# Patient Record
Sex: Female | Born: 1970 | Race: White | Hispanic: No | Marital: Married | Smoking: Current every day smoker
Health system: Southern US, Community
[De-identification: ages and names within clinical notes are randomized; demographics above are authoritative.]

## PROBLEM LIST (undated history)

## (undated) DIAGNOSIS — G2581 Restless legs syndrome: Secondary | ICD-10-CM

## (undated) DIAGNOSIS — F32A Depression, unspecified: Secondary | ICD-10-CM

## (undated) DIAGNOSIS — M545 Low back pain, unspecified: Secondary | ICD-10-CM

## (undated) DIAGNOSIS — F419 Anxiety disorder, unspecified: Secondary | ICD-10-CM

## (undated) DIAGNOSIS — T7840XA Allergy, unspecified, initial encounter: Secondary | ICD-10-CM

## (undated) DIAGNOSIS — K219 Gastro-esophageal reflux disease without esophagitis: Secondary | ICD-10-CM

## (undated) DIAGNOSIS — Z01419 Encounter for gynecological examination (general) (routine) without abnormal findings: Secondary | ICD-10-CM

## (undated) DIAGNOSIS — G4700 Insomnia, unspecified: Secondary | ICD-10-CM

## (undated) DIAGNOSIS — M1990 Unspecified osteoarthritis, unspecified site: Secondary | ICD-10-CM

## (undated) DIAGNOSIS — F329 Major depressive disorder, single episode, unspecified: Secondary | ICD-10-CM

## (undated) HISTORY — DX: Allergy, unspecified, initial encounter: T78.40XA

## (undated) HISTORY — PX: OTHER SURGICAL HISTORY: SHX169

## (undated) HISTORY — DX: Low back pain: M54.5

## (undated) HISTORY — DX: Anxiety disorder, unspecified: F41.9

## (undated) HISTORY — DX: Gastro-esophageal reflux disease without esophagitis: K21.9

## (undated) HISTORY — DX: Depression, unspecified: F32.A

## (undated) HISTORY — DX: Low back pain, unspecified: M54.50

## (undated) HISTORY — DX: Encounter for gynecological examination (general) (routine) without abnormal findings: Z01.419

## (undated) HISTORY — DX: Unspecified osteoarthritis, unspecified site: M19.90

## (undated) HISTORY — DX: Major depressive disorder, single episode, unspecified: F32.9

## (undated) HISTORY — DX: Insomnia, unspecified: G47.00

## (undated) HISTORY — DX: Restless legs syndrome: G25.81

---

## 1998-06-21 ENCOUNTER — Other Ambulatory Visit: Admission: RE | Admit: 1998-06-21 | Discharge: 1998-06-21 | Payer: Self-pay | Admitting: Obstetrics and Gynecology

## 1999-02-28 ENCOUNTER — Other Ambulatory Visit: Admission: RE | Admit: 1999-02-28 | Discharge: 1999-02-28 | Payer: Self-pay | Admitting: Family Medicine

## 2000-03-05 ENCOUNTER — Other Ambulatory Visit: Admission: RE | Admit: 2000-03-05 | Discharge: 2000-03-05 | Payer: Self-pay | Admitting: Obstetrics and Gynecology

## 2001-03-16 ENCOUNTER — Other Ambulatory Visit: Admission: RE | Admit: 2001-03-16 | Discharge: 2001-03-16 | Payer: Self-pay | Admitting: Obstetrics and Gynecology

## 2001-04-16 ENCOUNTER — Encounter: Admission: RE | Admit: 2001-04-16 | Discharge: 2001-04-16 | Payer: Self-pay | Admitting: Urology

## 2001-04-16 ENCOUNTER — Encounter: Payer: Self-pay | Admitting: Urology

## 2002-03-30 ENCOUNTER — Other Ambulatory Visit: Admission: RE | Admit: 2002-03-30 | Discharge: 2002-03-30 | Payer: Self-pay | Admitting: Obstetrics and Gynecology

## 2003-04-04 ENCOUNTER — Other Ambulatory Visit: Admission: RE | Admit: 2003-04-04 | Discharge: 2003-04-04 | Payer: Self-pay | Admitting: Obstetrics and Gynecology

## 2004-04-04 ENCOUNTER — Other Ambulatory Visit: Admission: RE | Admit: 2004-04-04 | Discharge: 2004-04-04 | Payer: Self-pay | Admitting: Obstetrics and Gynecology

## 2004-04-23 ENCOUNTER — Ambulatory Visit: Payer: Self-pay | Admitting: Family Medicine

## 2004-09-24 ENCOUNTER — Ambulatory Visit: Payer: Self-pay | Admitting: Family Medicine

## 2004-12-25 HISTORY — PX: NECK SURGERY: SHX720

## 2005-01-15 ENCOUNTER — Ambulatory Visit (HOSPITAL_COMMUNITY): Admission: RE | Admit: 2005-01-15 | Discharge: 2005-01-16 | Payer: Self-pay | Admitting: Orthopaedic Surgery

## 2005-04-04 ENCOUNTER — Other Ambulatory Visit: Admission: RE | Admit: 2005-04-04 | Discharge: 2005-04-04 | Payer: Self-pay | Admitting: Obstetrics and Gynecology

## 2005-04-24 ENCOUNTER — Encounter: Admission: RE | Admit: 2005-04-24 | Discharge: 2005-04-24 | Payer: Self-pay | Admitting: Obstetrics and Gynecology

## 2005-04-24 ENCOUNTER — Encounter (INDEPENDENT_AMBULATORY_CARE_PROVIDER_SITE_OTHER): Payer: Self-pay | Admitting: Specialist

## 2005-05-25 HISTORY — PX: SHOULDER ARTHROSCOPY: SHX128

## 2005-06-23 ENCOUNTER — Ambulatory Visit (HOSPITAL_COMMUNITY): Admission: RE | Admit: 2005-06-23 | Discharge: 2005-06-23 | Payer: Self-pay | Admitting: Specialist

## 2005-10-14 ENCOUNTER — Ambulatory Visit: Payer: Self-pay | Admitting: Family Medicine

## 2006-01-05 ENCOUNTER — Encounter: Admission: RE | Admit: 2006-01-05 | Discharge: 2006-01-05 | Payer: Self-pay | Admitting: Orthopaedic Surgery

## 2006-04-09 ENCOUNTER — Ambulatory Visit (HOSPITAL_COMMUNITY): Admission: RE | Admit: 2006-04-09 | Discharge: 2006-04-10 | Payer: Self-pay | Admitting: Orthopaedic Surgery

## 2006-06-04 ENCOUNTER — Ambulatory Visit (HOSPITAL_COMMUNITY): Admission: RE | Admit: 2006-06-04 | Discharge: 2006-06-05 | Payer: Self-pay | Admitting: Orthopaedic Surgery

## 2006-10-07 ENCOUNTER — Telehealth: Payer: Self-pay | Admitting: Family Medicine

## 2006-10-09 ENCOUNTER — Encounter: Admission: RE | Admit: 2006-10-09 | Discharge: 2006-10-09 | Payer: Self-pay | Admitting: Orthopaedic Surgery

## 2006-11-16 ENCOUNTER — Ambulatory Visit: Payer: Self-pay | Admitting: Family Medicine

## 2006-11-16 DIAGNOSIS — J309 Allergic rhinitis, unspecified: Secondary | ICD-10-CM | POA: Insufficient documentation

## 2006-11-16 DIAGNOSIS — G2581 Restless legs syndrome: Secondary | ICD-10-CM | POA: Insufficient documentation

## 2007-05-04 ENCOUNTER — Telehealth: Payer: Self-pay | Admitting: Family Medicine

## 2007-08-26 ENCOUNTER — Ambulatory Visit: Payer: Self-pay | Admitting: Family Medicine

## 2007-08-26 ENCOUNTER — Telehealth: Payer: Self-pay | Admitting: Family Medicine

## 2007-08-26 DIAGNOSIS — M545 Low back pain, unspecified: Secondary | ICD-10-CM | POA: Insufficient documentation

## 2007-08-26 DIAGNOSIS — R1013 Epigastric pain: Secondary | ICD-10-CM | POA: Insufficient documentation

## 2007-08-30 ENCOUNTER — Encounter: Admission: RE | Admit: 2007-08-30 | Discharge: 2007-08-30 | Payer: Self-pay | Admitting: Family Medicine

## 2007-08-30 ENCOUNTER — Telehealth: Payer: Self-pay | Admitting: Family Medicine

## 2007-09-28 ENCOUNTER — Encounter: Payer: Self-pay | Admitting: Family Medicine

## 2007-10-15 ENCOUNTER — Ambulatory Visit (HOSPITAL_COMMUNITY): Admission: RE | Admit: 2007-10-15 | Discharge: 2007-10-16 | Payer: Self-pay | Admitting: General Surgery

## 2007-10-15 ENCOUNTER — Encounter (INDEPENDENT_AMBULATORY_CARE_PROVIDER_SITE_OTHER): Payer: Self-pay | Admitting: General Surgery

## 2007-10-15 HISTORY — PX: CHOLECYSTECTOMY: SHX55

## 2007-11-15 ENCOUNTER — Ambulatory Visit: Payer: Self-pay | Admitting: Family Medicine

## 2007-11-25 ENCOUNTER — Telehealth: Payer: Self-pay | Admitting: Family Medicine

## 2007-11-26 ENCOUNTER — Telehealth: Payer: Self-pay | Admitting: Family Medicine

## 2008-03-22 ENCOUNTER — Ambulatory Visit: Payer: Self-pay | Admitting: Family Medicine

## 2008-04-12 ENCOUNTER — Ambulatory Visit: Payer: Self-pay | Admitting: Family Medicine

## 2008-06-16 ENCOUNTER — Ambulatory Visit: Payer: Self-pay | Admitting: Family Medicine

## 2008-06-20 ENCOUNTER — Encounter: Admission: RE | Admit: 2008-06-20 | Discharge: 2008-06-20 | Payer: Self-pay | Admitting: Obstetrics and Gynecology

## 2008-06-22 ENCOUNTER — Encounter: Admission: RE | Admit: 2008-06-22 | Discharge: 2008-06-22 | Payer: Self-pay | Admitting: Obstetrics and Gynecology

## 2008-10-23 ENCOUNTER — Telehealth: Payer: Self-pay | Admitting: Family Medicine

## 2008-11-27 ENCOUNTER — Encounter: Admission: RE | Admit: 2008-11-27 | Discharge: 2008-11-27 | Payer: Self-pay | Admitting: Obstetrics and Gynecology

## 2009-06-04 ENCOUNTER — Telehealth: Payer: Self-pay | Admitting: Family Medicine

## 2009-06-21 ENCOUNTER — Encounter: Admission: RE | Admit: 2009-06-21 | Discharge: 2009-06-21 | Payer: Self-pay | Admitting: Obstetrics and Gynecology

## 2009-08-20 ENCOUNTER — Telehealth: Payer: Self-pay | Admitting: Family Medicine

## 2009-08-20 ENCOUNTER — Ambulatory Visit: Payer: Self-pay | Admitting: Family Medicine

## 2009-08-20 DIAGNOSIS — G4700 Insomnia, unspecified: Secondary | ICD-10-CM | POA: Insufficient documentation

## 2009-08-21 LAB — CONVERTED CEMR LAB
Albumin: 3.8 g/dL (ref 3.5–5.2)
Alkaline Phosphatase: 64 units/L (ref 39–117)
BUN: 13 mg/dL (ref 6–23)
Basophils Relative: 0.3 % (ref 0.0–3.0)
CO2: 27 meq/L (ref 19–32)
Calcium: 9.1 mg/dL (ref 8.4–10.5)
Cholesterol: 193 mg/dL (ref 0–200)
GFR calc non Af Amer: 73.33 mL/min (ref >60)
Glucose, Bld: 73 mg/dL (ref 70–99)
HDL: 31.1 mg/dL — ABNORMAL LOW (ref >39.00)
Hemoglobin: 13.9 g/dL (ref 12.0–15.0)
Lymphocytes Relative: 18 % (ref 12.0–46.0)
Monocytes Absolute: 0.6 10*3/uL (ref 0.1–1.0)
Monocytes Relative: 3.3 % (ref 3.0–12.0)
Neutrophils Relative %: 76.6 % (ref 43.0–77.0)
Platelets: 294 10*3/uL (ref 150.0–400.0)
Potassium: 4.9 meq/L (ref 3.5–5.1)
RBC: 4.58 M/uL (ref 3.87–5.11)
TSH: 1.26 microintl units/mL (ref 0.35–5.50)
Total Bilirubin: 0.7 mg/dL (ref 0.3–1.2)
Triglycerides: 261 mg/dL — ABNORMAL HIGH (ref 0.0–149.0)
VLDL: 52.2 mg/dL — ABNORMAL HIGH (ref 0.0–40.0)

## 2009-12-11 ENCOUNTER — Telehealth: Payer: Self-pay | Admitting: Family Medicine

## 2009-12-12 ENCOUNTER — Telehealth: Payer: Self-pay | Admitting: Family Medicine

## 2010-03-11 ENCOUNTER — Telehealth: Payer: Self-pay | Admitting: Family Medicine

## 2010-03-12 ENCOUNTER — Telehealth: Payer: Self-pay | Admitting: Family Medicine

## 2010-03-26 NOTE — Assessment & Plan Note (Signed)
Summary: fu on meds/njr   Vital Signs:  Patient profile:   40 year old female Weight:      219 pounds BP sitting:   110 / 78  (left arm) Cuff size:   large  Vitals Entered By: Raechel Ache, RN (August 20, 2009 8:39 AM) CC: Med check.   History of Present Illness: Here to follow up on depression, insomnia, and restless legs. She is doing quite well. Her back doesn't bother her much at all. She is exercising and has lost 38 lbs in the past year.   Allergies: 1)  ! * Inh  Past History:  Past Medical History: Allergic rhinitis Depression restless legs insomnia Low back pain, sees Dr. Noel Gerold sees Dr. Duane Lope for GYN care  Past Surgical History: Reviewed history from 11/15/2007 and no changes required. back surgeries x2 (done on 04-09-06 and again on 06-23-06 at L5-S1 level per Dr. Noel Gerold) neck surgery 11/06 Shoulder scope 4/07 Cholecystectomy 10-15-07 per Dr. Lindie Spruce  Review of Systems  The patient denies anorexia, fever, weight gain, vision loss, decreased hearing, hoarseness, chest pain, syncope, dyspnea on exertion, peripheral edema, prolonged cough, headaches, hemoptysis, abdominal pain, melena, hematochezia, severe indigestion/heartburn, hematuria, incontinence, genital sores, muscle weakness, suspicious skin lesions, transient blindness, difficulty walking, depression, unusual weight change, abnormal bleeding, enlarged lymph nodes, angioedema, breast masses, and testicular masses.    Physical Exam  General:  overweight-appearing.   Lungs:  Normal respiratory effort, chest expands symmetrically. Lungs are clear to auscultation, no crackles or wheezes. Heart:  Normal rate and regular rhythm. S1 and S2 normal without gallop, murmur, click, rub or other extra sounds. Psych:  Cognition and judgment appear intact. Alert and cooperative with normal attention span and concentration. No apparent delusions, illusions, hallucinations   Impression & Recommendations:  Problem # 1:   LOW BACK PAIN (ICD-724.2)  The following medications were removed from the medication list:    Percocet 10-325 Mg Tabs (Oxycodone-acetaminophen) .Marland Kitchen... As needed Her updated medication list for this problem includes:    Robaxin 500 Mg Tabs (Methocarbamol) ..... Once daily    Etodolac 400 Mg Tabs (Etodolac) ..... Bid  Problem # 2:  RESTLESS LEG SYNDROME (ICD-333.94)  Problem # 3:  DEPRESSION (ICD-311)  Her updated medication list for this problem includes:    Wellbutrin Xl 300 Mg Xr24h-tab (Bupropion hcl) ..... Once daily    Alprazolam 1 Mg Tabs (Alprazolam) .Marland Kitchen... Three times a day as needed anxiety  Orders: UA Dipstick w/o Micro (automated)  (81003) Venipuncture ([address]) TLB-Lipid Panel (80061-LIPID) TLB-BMP (Basic Metabolic Panel-BMET) (80048-METABOL) TLB-CBC Platelet - w/Differential (85025-CBCD) TLB-Hepatic/Liver Function Pnl (80076-HEPATIC) TLB-TSH (Thyroid Stimulating Hormone) (84443-TSH)  Problem # 4:  INSOMNIA (ICD-780.52)  Complete Medication List: 1)  Requip 1 Mg Tabs (Ropinirole hydrochloride) .... 2 by mouth at bedtime 2)  Robaxin 500 Mg Tabs (Methocarbamol) .... Once daily 3)  Wellbutrin Xl 300 Mg Xr24h-tab (Bupropion hcl) .... Once daily 4)  Lyrica 150 Mg Caps (Pregabalin) .... Bid 5)  Etodolac 400 Mg Tabs (Etodolac) .... Bid 6)  Calcium 500/d 500-200 Mg-unit Tabs (Calcium carbonate-vitamin d) .... Bid 7)  Multivitamins Tabs (Multiple vitamin) .... Once daily 8)  Alprazolam 1 Mg Tabs (Alprazolam) .... Three times a day as needed anxiety 9)  Seasonique 0.15-0.03 &0.01 Mg Tabs (Levonorgest-eth estrad 91-day) .Marland Kitchen.. 1 once daily  Patient Instructions: 1)  get fasting labs today Prescriptions: ALPRAZOLAM 1 MG TABS (ALPRAZOLAM) three times a day as needed anxiety  #270 x 1   Entered and Authorized  by:   Nelwyn Salisbury MD   Signed by:   Nelwyn Salisbury MD on 08/20/2009   Method used:   Print then Give to Patient   RxID:   [id number] WELLBUTRIN XL 300 MG XR24H-TAB  (BUPROPION HCL) once daily  #90 x 3   Entered and Authorized by:   Nelwyn Salisbury MD   Signed by:   Nelwyn Salisbury MD on 08/20/2009   Method used:   Print then Give to Patient   RxID:   [id number] REQUIP 1 MG TABS (ROPINIROLE HYDROCHLORIDE) 2 by mouth at bedtime  #180 x 3   Entered and Authorized by:   Nelwyn Salisbury MD   Signed by:   Nelwyn Salisbury MD on 08/20/2009   Method used:   Print then Give to Patient   RxID:   [id number]   Appended Document: fu on meds/njr  Laboratory Results   Urine Tests    Routine Urinalysis   Color: yellow Appearance: Clear Glucose: negative   (Normal Range: Negative) Bilirubin: 3+   (Normal Range: Negative) Ketone: negative   (Normal Range: Negative) Spec. Gravity: 1.015   (Normal Range: 1.003-1.035) Blood: negative   (Normal Range: Negative) pH: 6.0   (Normal Range: 5.0-8.0) Protein: negative   (Normal Range: Negative) Urobilinogen: 0.2   (Normal Range: 0-1) Nitrite: negative   (Normal Range: Negative) Leukocyte Esterace: 1+   (Normal Range: Negative)    Comments: Rita Ohara  August 20, 2009 10:48 AM

## 2010-03-26 NOTE — Progress Notes (Signed)
Summary: rx needed  Phone Note Call from Patient Call back at Home Phone [phone]   Summary of Call: Forgot to ask him for Prevacid Rx.  She has been using the OTC.   The Nexium is too expensive.  Requests RX for insurance purposes.  [facility].  Allergic to INH.   Initial call taken by: Rudy Jew, RN,  August 20, 2009 11:16 AM  Follow-up for Phone Call        call in Prevacid 30 mg once daily , #30 with 11 rf Follow-up by: Nelwyn Salisbury MD,  August 20, 2009 12:53 PM    New/Updated Medications: PREVACID 30 MG CPDR (LANSOPRAZOLE) 1 once daily Prescriptions: PREVACID 30 MG CPDR (LANSOPRAZOLE) 1 once daily  #30 x 11   Entered by:   Raechel Ache, RN   Authorized by:   Nelwyn Salisbury MD   Signed by:   Raechel Ache, RN on 08/20/2009   Method used:   Electronically to        [facility]* (retail)       [address]       [place], [place]  [address]       Ph: [phone]       Fax: [phone]   RxID:   [id number]

## 2010-03-26 NOTE — Progress Notes (Signed)
Summary: refill alprazolam  Phone Note Refill Request Message from:  Fax from Pharmacy on June 04, 2009 1:47 PM  Refills Requested: Medication #1:  ALPRAZOLAM 1 MG TABS three times a day as needed anxiety.   Dosage confirmed as above?Dosage Confirmed   Supply Requested: 1 month   Last Refilled: 04/23/2009  Method Requested: Telephone to Pharmacy Initial call taken by: Raechel Ache, RN,  June 04, 2009 1:48 PM Caller: [facility]*  Follow-up for Phone Call        call in #90 with one rf. She needs to see me again soon Follow-up by: Nelwyn Salisbury MD,  June 05, 2009 10:01 AM  Additional Follow-up for Phone Call Additional follow up Details #1::        Rx called to pharmacy Additional Follow-up by: Raechel Ache, RN,  June 05, 2009 10:20 AM    Prescriptions: ALPRAZOLAM 1 MG TABS (ALPRAZOLAM) three times a day as needed anxiety  #90 x 1   Entered by:   Raechel Ache, RN   Authorized by:   Nelwyn Salisbury MD   Signed by:   Raechel Ache, RN on 06/05/2009   Method used:   Historical   RxID:   [id number]

## 2010-03-26 NOTE — Progress Notes (Signed)
Summary: refills message   Phone Note Call from Patient Call back at Home Phone [phone]   Caller: vm Summary of Call: Received phone calls [facility].  I will have to call them because that Rx was faxed in June by me.  Not sure what's going on.  That Rx is not showing any refills.   Initial call taken by: Rudy Jew, RN,  December 12, 2009 2:32 PM  Follow-up for Phone Call        at time of rx it was written with 3 refills   Follow-up by: Pura Spice, RN,  December 12, 2009 3:11 PM  Additional Follow-up for Phone Call Additional follow up Details #1::        Phone Call Completed Additional Follow-up by: Rudy Jew, RN,  December 12, 2009 5:09 PM

## 2010-03-26 NOTE — Progress Notes (Signed)
Summary: rx  buprop  Phone Note From Pharmacy   Caller: [facility]  Summary of Call: buprop 24 xl 300mg    Initial call taken by: Pura Spice, RN,  December 11, 2009 4:06 PM  Follow-up for Phone Call        when we saw her in June we gave her a one year supply (3 refills)  Follow-up by: Nelwyn Salisbury MD,  December 11, 2009 4:56 PM  Additional Follow-up for Phone Call Additional follow up Details #1::        left mess on vm  rx given 07-2009 to her. Additional Follow-up by: Pura Spice, RN,  December 11, 2009 5:02 PM

## 2010-03-28 NOTE — Progress Notes (Signed)
Summary: refill alprazolam   Phone Note From Pharmacy   Caller: [facility] Summary of Call: refill alprazolalm  1 mg  Initial call taken by: Pura Spice, RN,  March 12, 2010 5:57 PM  Follow-up for Phone Call        call in #270 with one rf Follow-up by: Nelwyn Salisbury MD,  March 13, 2010 10:56 AM  Additional Follow-up for Phone Call Additional follow up Details #1::        faxed   to [facility]  Additional Follow-up by: Pura Spice, RN,  March 13, 2010 2:02 PM    New/Updated Medications: ALPRAZOLAM 1 MG TABS (ALPRAZOLAM) three times a day as needed anxiety Prescriptions: ALPRAZOLAM 1 MG TABS (ALPRAZOLAM) three times a day as needed anxiety  #270 x 1   Entered by:   Pura Spice, RN   Authorized by:   Nelwyn Salisbury MD   Signed by:   Pura Spice, RN on 03/13/2010   Method used:   Printed then faxed to ...       [facility] ([facility])       [address] [place], [place]  [address]       Ph: [phone]       Fax: [phone]   RxID:   [id number]

## 2010-03-28 NOTE — Progress Notes (Signed)
Summary: REFILL REQUEST  Phone Note Refill Request   Refills Requested: Medication #1:  PREVACID 30 MG CPDR 1 once daily.   Notes: Pt needs 90-day supply sent to [facility] - [address]  [address] [place], [place] [address]   [phone]    Initial call taken by: Debbra Riding,  March 11, 2010 9:51 AM    Prescriptions: PREVACID 30 MG CPDR (LANSOPRAZOLE) 1 once daily  #90 x 0   Entered by:   Lynann Beaver CMA AAMA   Authorized by:   Nelwyn Salisbury MD   Signed by:   Lynann Beaver CMA AAMA on 03/11/2010   Method used:   Electronically to        [facility] [phone]* (retail)       [address] [place]       [place], [place]  [address]       Ph: [phone] or [phone]       Fax: [phone]   RxID:   [id number]

## 2010-07-09 NOTE — Op Note (Signed)
NAME:  Lacey Sullivan, Lacey Sullivan NO.:  [id number]   MEDICAL RECORD NO.:  [id number]          PATIENT TYPE:  OIB   LOCATION:  5123                         FACILITY:  [facility]   PHYSICIAN:  Cherylynn Ridges, M.D.    DATE OF BIRTH:  1970-11-16   DATE OF PROCEDURE:  10/15/2007  DATE OF DISCHARGE:                               OPERATIVE REPORT   PREOPERATIVE DIAGNOSIS:  Symptomatic cholelithiasis.   POSTOPERATIVE DIAGNOSIS:  Symptomatic cholelithiasis with chronic  cholecystitis.   PROCEDURE:  Laparoscopic cholecystectomy with cholangiogram.   SURGEON:  Cherylynn Ridges, MD   ANESTHESIA:  General endotracheal.   ESTIMATED BLOOD LOSS:  100 mL.   COMPLICATIONS:  None.   CONDITION:  Stable.   FINDINGS:  The patient had an intrahepatic gallbladder with chronic  cholecystitis changes.  Intraoperative cholangiogram was normal with  good flow into the duodenum, no intraductal filling defects, no  dilatation.   INDICATIONS FOR OPERATION:  The patient is a 40 year old with  symptomatic gallstones who comes in now for an elective laparoscopic  cholecystectomy.   OPERATION:  The patient was taken to the operating room, placed on table  in supine position.  After an adequate general endotracheal anesthetic  was administered, she was prepped and draped in usual sterile manner  exposing the midline and right upper quadrant.   A supraumbilical curvilinear incision was made using #11 blade and taken  down to the midline fascia.  We grabbed the midline fascia with 2 Kocher  clamps and then incised between the clamps with 15 blade into the  preperitoneal space.  As we tented up on the fascia, we bluntly  dissected down through the preperitoneal space entering the peritoneal  cavity.  Once we entered peritoneal cavity, a pursestring suture of 0  Vicryl was passed around the fascial opening, which secured in place a  Hasson cannula, which was subsequently passed.  Once the Hasson cannula  was secured in place with a pursestring suture, carbon dioxide gas was  insufflated through the cannula into the peritoneal cavity up to a  maximal intra-abdominal pressure of 50 mmHg.   Once the abdomen was distended, two right costal margin 5-mm cannulas  and a subxiphoid 11-mm cannula were passed under direct vision.  The  patient was placed in reverse Trendelenburg.  The left-side was tilted  down and we began the dissection.   The gallbladder was noted to be intrahepatic away from the edge of the  gallbladder and had to be retracted by pushing it up towards the  anterior abdominal wall, more so then retracting it towards the right  upper quadrant.  There were 2 parts of the body and the infundibulum.  The infundibulum was stuck down towards the common duct.  We had to  carefully dissect that away from the surrounding structures.  The cystic  duct and the cystic artery were identified.  A clip was placed on the  duct and cholecystodochotomy made, through which a Cook catheter was  passed to show the cholangiogram.  The cholangiogram showed good flow  into the duodenum, no intraductal  filling defects, no dilatation of the  common duct, and good proximal filling.   Once the cholangiogram was completed, we removed the clips securing it  in place, placed 3 clips on the distal cystic duct, then transected it.  The cystic artery was transected proximally, distally, and with clips.  We then dissected out the gallbladder from its bed with a tear in the  gallbladder creating some bile leakage, but no spillage of stones.  We  were able to do so; however, the liver was very soft from being a fatty  liver and there was some tearing and causing bleeding, which had to be  controlled with electrocautery and Surgicel.   All counts were correct at this point.  Once we had the bleeding  controlled electrocautery, we irrigated about 3 L of saline solution  finding there to be adequate control of  bleeding.  We then aspirated all  fluid and gas from above the liver.  Once this was done, we removed all  cannulas.  The supraumbilical fascia was closed using a pursestring  suture, which was in place.  We injected 0.25% Marcaine with epi at all  sites and we closed the supraumbilical and subxiphoid sites with running  subcuticular stitch of 4-0 Vicryl.  We then used Dermabond and Steri-  Strips on all wounds and Tegaderm and 0.25% Marcaine with epi was  injected at all sites.  All needle counts, sponge counts, and instrument  counts were correct.      Cherylynn Ridges, M.D.  Electronically Signed     JOW/MEDQ  D:  10/15/2007  T:  10/16/2007  Job:  [id number]   cc:   Jeannett Senior A. Clent Ridges, MD

## 2010-07-12 ENCOUNTER — Ambulatory Visit (INDEPENDENT_AMBULATORY_CARE_PROVIDER_SITE_OTHER): Payer: Managed Care, Other (non HMO) | Admitting: Family Medicine

## 2010-07-12 ENCOUNTER — Encounter: Payer: Self-pay | Admitting: Family Medicine

## 2010-07-12 VITALS — BP 122/82 | HR 132 | Temp 99.6°F | Wt 236.0 lb

## 2010-07-12 DIAGNOSIS — G589 Mononeuropathy, unspecified: Secondary | ICD-10-CM

## 2010-07-12 DIAGNOSIS — E669 Obesity, unspecified: Secondary | ICD-10-CM

## 2010-07-12 DIAGNOSIS — F32A Depression, unspecified: Secondary | ICD-10-CM

## 2010-07-12 DIAGNOSIS — N390 Urinary tract infection, site not specified: Secondary | ICD-10-CM

## 2010-07-12 DIAGNOSIS — F329 Major depressive disorder, single episode, unspecified: Secondary | ICD-10-CM

## 2010-07-12 DIAGNOSIS — G629 Polyneuropathy, unspecified: Secondary | ICD-10-CM

## 2010-07-12 LAB — T3, FREE: T3, Free: 3.2 pg/mL (ref 2.3–4.2)

## 2010-07-12 LAB — POCT URINALYSIS DIPSTICK
Ketones, UA: NEGATIVE
Protein, UA: NEGATIVE
Spec Grav, UA: 1.005
Urobilinogen, UA: 0.2

## 2010-07-12 LAB — CBC WITH DIFFERENTIAL/PLATELET
Basophils Relative: 0.3 % (ref 0.0–3.0)
HCT: 37.7 % (ref 36.0–46.0)
Hemoglobin: 12.8 g/dL (ref 12.0–15.0)
Lymphocytes Relative: 23.4 % (ref 12.0–46.0)
Lymphs Abs: 3.3 10*3/uL (ref 0.7–4.0)
Monocytes Relative: 3.9 % (ref 3.0–12.0)
Neutro Abs: 10 10*3/uL — ABNORMAL HIGH (ref 1.4–7.7)
RBC: 4.33 Mil/uL (ref 3.87–5.11)

## 2010-07-12 LAB — LIPID PANEL
Cholesterol: 149 mg/dL (ref 0–200)
HDL: 34.1 mg/dL — ABNORMAL LOW (ref >39.00)
LDL Cholesterol: 82 mg/dL (ref 0–99)
Total CHOL/HDL Ratio: 4
Triglycerides: 167 mg/dL — ABNORMAL HIGH (ref 0.0–149.0)

## 2010-07-12 LAB — BASIC METABOLIC PANEL
CO2: 27 mEq/L (ref 19–32)
Calcium: 9.1 mg/dL (ref 8.4–10.5)
Chloride: 107 mEq/L (ref 96–112)
Potassium: 4.6 mEq/L (ref 3.5–5.1)
Sodium: 142 mEq/L (ref 135–145)

## 2010-07-12 LAB — T4, FREE: Free T4: 0.93 ng/dL (ref 0.60–1.60)

## 2010-07-12 LAB — TSH: TSH: 1.25 u[IU]/mL (ref 0.35–5.50)

## 2010-07-12 MED ORDER — BUPROPION HCL ER (XL) 300 MG PO TB24: 300.0000 mg | ORAL_TABLET | Freq: Every day | ORAL | Status: DC

## 2010-07-12 MED ORDER — ALPRAZOLAM 1 MG PO TABS: 1.0000 mg | ORAL_TABLET | Freq: Three times a day (TID) | ORAL | Status: DC | PRN

## 2010-07-12 MED ORDER — SULFAMETHOXAZOLE-TMP DS 800-160 MG PO TABS: 1.0000 | ORAL_TABLET | Freq: Two times a day (BID) | ORAL | Status: AC

## 2010-07-12 NOTE — Progress Notes (Signed)
  Subjective:    Patient ID: Lacey Sullivan, female    DOB: [birth year], 40 y.o.   MRN: [id number]  HPI Here for follow up and to ask advice. She has gotten information about lap band bariatric surgery, and she asks if I think this would be a good idea for her. She was able to lose 38 lbs last year by exercising and going to [employer], but she stopped and has gained 20 of these lbs back. She feels well in general, although 2 weeks ago she developed some urinary burning. No fever or nausea. She drinks plenty of water.    Review of Systems  Constitutional: Negative.   Respiratory: Negative.   Cardiovascular: Negative.   Gastrointestinal: Negative.   Genitourinary: Positive for dysuria. Negative for urgency, frequency and flank pain.  Psychiatric/Behavioral: Negative.        Objective:   Physical Exam  Constitutional: She appears well-developed and well-nourished.  Cardiovascular: Normal rate, regular rhythm, normal heart sounds and intact distal pulses.   Pulmonary/Chest: Effort normal and breath sounds normal.  Abdominal: Soft. Bowel sounds are normal.  Psychiatric: She has a normal mood and affect. Her behavior is normal.          Assessment & Plan:  First of all, I refilled her regular meds. She has a UTI so we will culture the urine and treat this with Bactrim DS. She is fasting so we will get labs. Finally, I advised that she not pursue bariatric surgery because the risks would outweigh the benefits for her. She is overweight but not morbidly obese to the point that she needs such a surgery. I advised she start exercising and eating a healthy diet again and to stick with this.

## 2010-07-12 NOTE — H&P (Signed)
Lacey Sullivan, Lacey Sullivan                ACCOUNT NO.:  [id number]   MEDICAL RECORD NO.:  [id number]          PATIENT TYPE:  AMB   LOCATION:  SDS                          FACILITY:  [facility]   PHYSICIAN:  Sharolyn Douglas, M.D.        DATE OF BIRTH:  04-Jun-1970   DATE OF ADMISSION:  04/09/2006  DATE OF DISCHARGE:                              HISTORY & PHYSICAL   CHIEF COMPLAINT:  Low back and left lower extremity pain.   HISTORY OF PRESENT ILLNESS:  This patient is a 40 year old female who  has been having problems with her left lower extremity pain.  She has  failed conservative treatments.  Her pain is severe.  It is thought her  best course of management would be a microdiskectomy at L5-S1 on the  left.  Risks and benefits of the surgery were discussed with the patient  by Dr. Noel Gerold as well as myself.  Indicated understanding and opted to  proceed.   ALLERGIES:  INH.   MEDICATIONS:  1. Percocet 5 mg two to three per day.  2. Lexapro.  3. Ortho Tri-Cyclen.  4. Requip.   PAST MEDICAL HISTORY:  Healthy.   PAST SURGICAL HISTORY:  ACDF in November of 2006 at C6-7.   SOCIAL HISTORY:  The patient smokes one pack of cigarettes per day.  She  denies alcohol use.  She is married and has no children.   FAMILY HISTORY:  Noncontributory.   REVIEW OF SYSTEMS:  Negative.   PHYSICAL EXAMINATION:  VITAL SIGNS:  Blood pressure is 134/78.  Respirations are 16 and unlabored.  Pulse of 82 and regular.  GENERAL APPEARANCE:  The patient is a 40 year old white female who is  alert and oriented, in no acute distress.  She is well-developed, well-  groomed, appears her stated age.  Pleasant and cooperative during exam.  HEENT:  Head is normocephalic, atraumatic.  Pupils are equal, round and  reactive to light.  Extraocular movements are intact.  Nares are patent  and pharynx is clear.  NECK:  Supple to palpation.  No lymphadenopathy, thyromegaly or bruits  appreciated.  CHEST:  Clear to auscultation  bilaterally.  No rales, rhonchi, stridor,  wheezes or friction rubs.  BREAST EXAM:  Not pertinent and not performed.  HEART:  S1, S2, regular rate and rhythm.  No murmurs, gallops or rubs  noted.  ABDOMEN:  Soft to palpation, nontender, nondistended, no organomegaly  noted.  GU:  Not pertinent and not performed.  EXTREMITIES:  As per HPI.  SKIN:  Intact without any lesions or rashes.   IMPRESSION:  Herniated nucleus pulposus L5-S1 on the left.   PLAN:  Admit to [facility] on April 09, 2006 for a  microdiskectomy, microendoscopic diskectomy on the left at L5-S1.  Surgeon will be Dr. Noel Gerold.      Verlin Fester, P.A.      Sharolyn Douglas, M.D.  Electronically Signed    CM/MEDQ  D:  04/07/2006  T:  04/08/2006  Job:  [id number]

## 2010-07-12 NOTE — Op Note (Signed)
NAME:  Lacey Sullivan, KAMIYA NO.:  [id number]   MEDICAL RECORD NO.:  [id number]          PATIENT TYPE:  OIB   LOCATION:  2550                         FACILITY:  [facility]   PHYSICIAN:  Sharolyn Douglas, M.D.        DATE OF BIRTH:  [birth year]   DATE OF PROCEDURE:  06/04/2006  DATE OF DISCHARGE:                               OPERATIVE REPORT   DIAGNOSIS:  Recurrent left L5-S1 disc herniation, with severe left lower  extremity radiculopathy.   PROCEDURES:  Revision left L5-S1 diskectomy.   SURGEON:  Sharolyn Douglas, M.D.   ASSISTANT:  Verlin Fester, P.A.   ANESTHESIA:  General endotracheal.   ESTIMATED BLOOD LOSS:  Minimal.   NEEDLE AND SPONGE COUNT:  Correct.   INDICATIONS:  The patient is a pleasant 40 year old female who is 2  months status post previous left L5-S1 microdiskectomy and lateral  recess decompression.  She did quite well initially, with resolution of  her leg pain.  She then had a fall and redeveloped severe leg pain.  The  MRI scan was completed and demonstrated a recurrent disc herniation.  She has failed to respond to conservative treatment and now elects to  undergo revision surgery.  Risk, benefits, and alternatives were  reviewed.   PROCEDURE:  After informed consent, she was taken to the operating room.  Underwent general endotracheal anesthesia without difficulty. Given  prophylactic IV antibiotics.  Carefully turned prone onto the Wilson  frame.  All bony prominences were padded.  Face and eyes protected at  all times.  Back prepped, and draped in the usual sterile fashion.  The  previous 3 cm incision was utilized in the midline.  Fluoroscopy was  brought into the field, and the deep fascia was incised.  The  fluoroscopy was used to dock the retractors from the Maxess retractor  system onto the interlaminar space at L5-S1.  The retractor was placed  with the length 70 mm blades and then attached to the operating room  table.  The retractor was gently  spread, dilating the paraspinal  muscles.  The microscope was draped and brought onto the field.  Using  curettes, we defined the previous laminotomy.  Great care was taken not  to damage the underlying dura.  As we entered the spinal canal,  immediately several fragments of disc were identified and removed in the  lateral recess.  Mobilization of the S1 nerve root revealed additional  disc fragments which were free within the canal.  We then found the  annular defect, using Epstein curette to decompress more disc material  back into the interspace, which was removed with the pituitaries.  The  spinal canal was then evaluated again, and we felt that the S1 nerve  root was decompressed.  On inspection of the nerve root sleeve, there  appeared to be a small dural rent.  This was not in at location that  could be repaired, and therefore we elected to place Tisseel fibrin glue  into the laminotomy.  After hemostasis was achieved, the Tisseel was  left in the laminotomy defect  along with a piece of Gelfoam.  The wound  was then closed in layers tightly, using #1 Vicryl suture on the fascia,  0 Vicryl on the subcutaneous layer, 2-0 Vicryl, and then a running 3-0  subcuticular Vicryl suture on the edges.  Dermabond was applied.  The  patient was turned supine, extubated without difficulty, and transferred  to recovery in stable condition.   It should be noted that my assistant, Verlin Fester, P.A., was present,  throughout the procedure, including the positioning, the exposure, the  decompression, and she assisted with wound closure.      Sharolyn Douglas, M.D.  Electronically Signed     MC/MEDQ  D:  06/04/2006  T:  06/05/2006  Job:  [id number]

## 2010-07-12 NOTE — Op Note (Signed)
NAMEJALAN, BODI NO.:  [id number]   MEDICAL RECORD NO.:  [id number]          PATIENT TYPE:  AMB   LOCATION:  DAY                          FACILITY:  [facility]   PHYSICIAN:  Jene Every, M.D.    DATE OF BIRTH:  [birth year]   DATE OF PROCEDURE:  06/23/2005  DATE OF DISCHARGE:                                 OPERATIVE REPORT   PREOPERATIVE DIAGNOSIS:  Impingement syndrome. Adhesive capsulitis of the  right shoulder.   POSTOPERATIVE DIAGNOSIS:  Impingement syndrome. Adhesive capsulitis of the  right shoulder, degenerative labral tear.   PROCEDURES PERFORMED:  1.  Examination under anesthesia, manipulation under anesthesia.  2.  Right shoulder arthroscopy subacromial decompression, bursectomy,      debridement of labral tear.   ANESTHESIA:  General anesthesia.   ASSISTANT:  Roma Schanz, P.A.   INDICATIONS FOR PROCEDURE:  This is a 40 year old who is status post a  cervical fusion and had persistent shoulder related pain, diminished range  of motion, positive impingement sign, positive impingement test.  MRI  indicating no evidence of tear.  Bursitis was noted. Degenerative changes  were noted on the [facility] but she nontender over the [facility].  With temporary relief  from subacromial corticosteroid injection, we offered her an examination  under anesthesia, manipulation under anesthesia followed by a subacromial  decompression bursectomy and evaluation of the glenohumeral joint.  Risks  and benefits discussed including bleeding, infection, __________ suboptimal  range of motion, persistent pain, need for repeat debridement in the future,  etc.   DESCRIPTION OF PROCEDURE:  Patient placed in the supine beach chair position  after the induction of adequate general anesthesia, 1 g Kefzol, right  shoulder and upper extremity was prepped and draped in the usual sterile  fashion.  Surgical marker was utilized to delineate the acromion.  Incision  was made over the  posterolateral skin for a posterior portal approach.  The  cannula was introduced in the glenohumeral joint.  The arm was in the 70/30  position.  This was without difficulty.  Rotator cuff was examined and was  found to be unremarkable.  Biceps tendon was unremarkable.  There was  degenerative tearing and fraying of the labrum in the flap region about 10  o'clock.  Small anterior portal was then made through the skin only and I  introduced a cannula into the glenohumeral joint.  Collene Mares was introduced and  utilized to shave this portion of the labrum.  The remainder of the labrum  was attached.  There was no detachment of the labrum.  Subscapularis was  unremarkable.  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 The glenoid  and the humeral head demonstrated no evidence of chondral defect.  Redirected in the subacromial space and I placed an incision through the  skin anterolateral portal and advanced a cannula into the subacromial space.  Hypertrophic bursa was noted throughout.  I introduced a shaver and utilized  to perform a bursectomy.  Full examination of the rotator cuff revealed no  evidence of a tear. This was carried out from the anterolateral aspect  of  the acromion to the [facility] joint.  I then introduced an ArthroWand and utilized  it to attach the CA ligament and morcellized this.  This provided more room  in the subacromial space.  The shaver was introduced and utilized to shave  the small spurring area over the anterolateral aspect of the acromion.  Again, full inspection revealed no evidence of a tear. We had performed a  full bursectomy.  No further impingement was noted.  Next, all  instrumentation was removed.  Marcaine 0.25% with epinephrine was  infiltrated in the joint.  The wound was  dressed sterilely.  She was awakened without difficulty and transported to  the recovery room in satisfactory condition after placement of a sling.  All  portals were closed with 4-0 nylon  simple sutures and covered sterilely  prior to that.  Patient tolerated the procedure well with no complications.      Jene Every, M.D.  Electronically Signed     JB/MEDQ  D:  06/23/2005  T:  06/24/2005  Job:  [id number]

## 2010-07-12 NOTE — Op Note (Signed)
NAME:  Lacey, Sullivan NO.:  [id number]   MEDICAL RECORD NO.:  [id number]          PATIENT TYPE:  OIB   LOCATION:  5014                         FACILITY:  [facility]   PHYSICIAN:  Sharolyn Douglas, M.D.        DATE OF BIRTH:  1971/02/23   DATE OF PROCEDURE:  04/09/2006  DATE OF DISCHARGE:                               OPERATIVE REPORT   DIAGNOSIS:  L5-S1 degenerative disk disease, disk herniation and lateral  recess narrowing with left lower extremity radiculopathy.   PROCEDURE:  Left L5-S1 microdiskectomy and lateral recess decompression  using the minimally invasive retractor.   SURGEON:  Sharolyn Douglas, MD   ASSISTANT:  Verlin Fester, PA   ANESTHESIA:  General endotracheal.   ESTIMATED BLOOD LOSS:  Minimal.   COMPLICATIONS:  None.   NEEDLE AND SPONGE COUNT:  Correct.   INDICATIONS:  The patient is a pleasant 40 year old female with  persistent back and left lower extremity radicular pain.  Her MRI scan  shows degenerative changes and disk herniation on the left at L5-S1 with  associated lateral recess narrowing.  She has failed other attempts at  conservative treatment and now presents for micro-decompression.  Risks,  benefits and alternatives were reviewed.   PROCEDURE:  After informed consent, she was taken to the operating room.  She underwent general endotracheal anesthesia without difficulty, given  prophylactic IV antibiotics, carefully turned prone onto the Wilson  frame, all bony prominences padded, face and eyes protected at all  times, back prepped and draped in the usual sterile fashion.  Fluoroscopy was brought into the field and the L5-S1 disk space was  identified.  A 2-cm incision was made over the interspace in the  midline.  Dissection was carried through the deep fascia.  Dilators from  the Max S retractor were then used to dock onto the L5-S1 interspace  using fluoroscopy.  The 80-mm retractor blades were then placed over the  final dilator  using fluoroscopy.  The retractor was attached to the  operating room table and expanded.  Final fluoroscopic imaging was again  achieved, showing positioning over the L5-S1 interspace.  The microscope  was then draped and brought into the field.  A high-speed bur was used  to remove the inferior 2/3 of the L5 lamina and medial 1/3 of the facet  joint complex.  Ligamentum flavum was removed piecemeal.  The S1 nerve  root was identified and gently mobilized medially.  It was being  compressed within the lateral recess due to the facet joint and also the  disk herniation.  The disk space was entered and decompressed back into  the interspace.  Several fragments of disk were removed with the  pituitary.  The disk itself was very degenerative and there was a  paucity of disk within the interspace itself.  The spinal canal was  explored with a blunt probe and it was felt that there was no further  compression.  Hemostasis was achieved.  Two milliliters of fentanyl were  left over the nerve root for postoperative analgesia.  The deep layer  was closed with a #1 Vicryl suture, subcutaneous layer closed with 0  Vicryl and 2-0 Vicryl.  A 3-0 subcuticular suture was utilized on the  skin edges followed by Dermabond.  The patient was turned supine,  extubated without difficulty and transferred to Recovery in stable  condition, neurologically intact.   It should be noted that my assistant, Verlin Fester, PA, was present  throughout the procedure including the positioning, the exposure, the  decompression and she also assisted with wound closure.      Sharolyn Douglas, M.D.  Electronically Signed     MC/MEDQ  D:  04/09/2006  T:  04/10/2006  Job:  [id number]

## 2010-07-12 NOTE — Op Note (Signed)
NAME:  Lacey Sullivan, Lacey Sullivan NO.:  [id number]   MEDICAL RECORD NO.:  [id number]          PATIENT TYPE:  OIB   LOCATION:  5017                         FACILITY:  [facility]   PHYSICIAN:  Sharolyn Douglas, M.D.        DATE OF BIRTH:  Nov 23, 1970   DATE OF PROCEDURE:  01/15/2005  DATE OF DISCHARGE:                                 OPERATIVE REPORT   DIAGNOSIS:  C6-7 herniated nucleus pulposus with severe right upper  extremity radiculopathy.   PROCEDURE:  1.  Anterior cervical diskectomy C6-7.  2.  Anterior cervical arthrodesis C6-7 with placement of allograft      prosthesis spacer packed with local autogenous bone graft.  3.  Anterior cervical plating C6-7 using the Abbott spine system.   SURGEON:  Sharolyn Douglas, M.D.   ASSISTANT:  Verlin Fester, P.A.   ANESTHESIA:  General endotracheal.   COMPLICATIONS:  None.   ESTIMATED BLOOD LOSS:  Minimal.   INDICATIONS:  The patient is a pleasant 40 year old female with severe pain  thought to be related to a right upper extremity radiculopathy. Her imaging  studies demonstrate a herniated disk at C6-7 on the right side consistent  with her symptomatology. She has been unresponsive to conservative treatment  and has elected to undergo ACDF at C6-7 in hopes of improving her symptoms.  Risk and benefits were extensively reviewed and she elected to proceed.   DESCRIPTION OF PROCEDURE:  The patient was identified in the holding area,  taken to the operating room, underwent general endotracheal anesthesia  without difficulty, given prophylactic IV antibiotics.  Positioned on the  operating room table, supine, with the Mayfield head rest and neck in slight  extension, 5 pounds halter traction applied. All bony prominences padded.  The face and eyes protected at all times. A small transverse incision was  made on the left side of the neck at the level of the cricoid cartilage in a  natural skin crease. Dissection was carried sharply through  platysma.  Interval between the SCM and strap muscles medially was developed down to  the prevertebral space. The esophagus, trachea, and carotid sheath were  identified and protected all times.   The C6-7 level was identified, spinal needle placed, x-ray taken to confirm  level. The longus coli muscle elevated out over the C6-7 disk space  bilaterally. Deep retractors were placed. The microscope was draped and  brought into the field. Caspar distraction pins placed at the C6-C7  vertebral bodies. Diskectomy was carried back to the posterior longitudinal  ligament. There was a rent identified in the right posterior lateral  position. High-speed bur then used to remove the cartilaginous endplates and  take down the posterior vertebral margins. The posterior longitudinal  ligament was then taken down from the midline out towards the right foramen.  A moderate-sized disk herniation was removed from the spinal canal which  extended into the foramen. A foraminotomy was completed on the right side.  The foramen was explored with a nerve hook and felt to be decompressed. The  wound was irrigated. Hemostasis was achieved.  An 8 mm allograft prosthesis spacer was packed with local bone graft  obtained from the drill shavings. This was inserted into the interspace and  countersunk 1 mm. We then placed a 24 mm Abbott spine anterior cervical  plate from N5-A2 with 12-mm screws. We ensured that all four screws locked  below the locking mechanism. The wound was irrigated. The esophagus,  trachea, and carotid sheath were examined; there were no apparent injuries.  Intraoperative x-rays showed appropriate positioning of the instrumentation.  A deep Penrose drain was left in place. The platysma was closed with  interrupted 2-0 Vicryl suture. Subcutaneous layer closed with interrupted 3-  0 Vicryl suture. The skin was closed with a running 4-0 subcuticular Vicryl  suture. Benzoin and Steri-Strips placed.  A sterile dressing applied. The  patient was extubated without difficulty and transferred to recovery in  stable condition and able to move her upper and lower extremities. It should  be noted that she was placed into a cervical collar performed leaving the  operating room.      Sharolyn Douglas, M.D.  Electronically Signed     MC/MEDQ  D:  01/15/2005  T:  01/16/2005  Job:  [id number]

## 2010-07-15 ENCOUNTER — Telehealth: Payer: Self-pay

## 2010-07-15 LAB — URINE CULTURE

## 2010-07-15 NOTE — Progress Notes (Signed)
Quick Note:  Pt is aware. ______ 

## 2010-07-15 NOTE — Telephone Encounter (Signed)
No she would need to see a doctor there, and then they could take over giving the shots

## 2010-07-15 NOTE — Telephone Encounter (Signed)
Pt is not living in [place] anymore.  Pt states she now lives in [place]. Pt would like to know if she could go to a doctor's office in [place] to get B12 injections

## 2010-07-16 NOTE — Telephone Encounter (Signed)
Left a message for pt on voicemail.

## 2010-08-02 ENCOUNTER — Other Ambulatory Visit: Payer: Self-pay | Admitting: Obstetrics and Gynecology

## 2010-11-14 ENCOUNTER — Telehealth: Payer: Self-pay | Admitting: Family Medicine

## 2010-11-14 NOTE — Telephone Encounter (Signed)
Refill request for Ropinirole 1 mg take 2 po qhs. Pt was last seen on 07/12/10.

## 2010-11-15 MED ORDER — ROPINIROLE HCL 1 MG PO TABS: 2.0000 mg | ORAL_TABLET | Freq: Every day | ORAL | Status: DC

## 2010-11-15 NOTE — Telephone Encounter (Signed)
Call in one year supply  

## 2010-11-15 NOTE — Telephone Encounter (Signed)
rx called into pharmacy

## 2011-01-14 ENCOUNTER — Telehealth: Payer: Self-pay | Admitting: Family Medicine

## 2011-01-14 NOTE — Telephone Encounter (Signed)
Refill request for Alprazolam 1 mg take 1 po tid prn and pt last here on 07/12/10. ( 3 month supply and send to [facility] )

## 2011-01-15 MED ORDER — ALPRAZOLAM 1 MG PO TABS: 1.0000 mg | ORAL_TABLET | Freq: Three times a day (TID) | ORAL | Status: DC | PRN

## 2011-01-15 NOTE — Telephone Encounter (Signed)
Call in #[phone] with one rf 

## 2011-01-15 NOTE — Telephone Encounter (Signed)
rx called into pharmacy

## 2011-01-20 ENCOUNTER — Telehealth: Payer: Self-pay | Admitting: Family Medicine

## 2011-01-20 NOTE — Telephone Encounter (Signed)
It looks like this patient's [facility] [place]? She states that [facility]. Please make note of this & change in chart please. Thanks.

## 2011-01-21 NOTE — Telephone Encounter (Signed)
Both retail and mail order pharms are listed in chart - [facility]

## 2011-06-12 ENCOUNTER — Telehealth: Payer: Self-pay | Admitting: Family Medicine

## 2011-06-13 NOTE — Telephone Encounter (Signed)
Call in a 30 day supply only. She needs an OV after that

## 2011-06-16 ENCOUNTER — Telehealth: Payer: Self-pay | Admitting: Family Medicine

## 2011-06-16 NOTE — Telephone Encounter (Signed)
[facility] [place] [place] on [address].  She has also scheduled an appointment.

## 2011-06-16 NOTE — Telephone Encounter (Signed)
I left voice message for pt to return my call. The script for Wellbutrin XL was approved for a 30 day supply and I needed to call in to a local pharmacy. I need the pharmacy name & number.

## 2011-06-16 NOTE — Telephone Encounter (Signed)
I called in script 

## 2011-07-04 ENCOUNTER — Encounter: Payer: Self-pay | Admitting: Family Medicine

## 2011-07-04 ENCOUNTER — Ambulatory Visit (INDEPENDENT_AMBULATORY_CARE_PROVIDER_SITE_OTHER): Payer: Managed Care, Other (non HMO) | Admitting: Family Medicine

## 2011-07-04 VITALS — BP 116/74 | HR 119 | Temp 99.0°F | Wt 227.0 lb

## 2011-07-04 DIAGNOSIS — F32A Depression, unspecified: Secondary | ICD-10-CM

## 2011-07-04 DIAGNOSIS — G2581 Restless legs syndrome: Secondary | ICD-10-CM

## 2011-07-04 DIAGNOSIS — F329 Major depressive disorder, single episode, unspecified: Secondary | ICD-10-CM

## 2011-07-04 DIAGNOSIS — F3289 Other specified depressive episodes: Secondary | ICD-10-CM

## 2011-07-04 MED ORDER — ALPRAZOLAM 1 MG PO TABS: 1.0000 mg | ORAL_TABLET | Freq: Two times a day (BID) | ORAL | Status: DC | PRN

## 2011-07-04 MED ORDER — BUPROPION HCL ER (XL) 300 MG PO TB24: 300.0000 mg | ORAL_TABLET | ORAL | Status: DC

## 2011-07-04 MED ORDER — ROPINIROLE HCL 1 MG PO TABS: 2.0000 mg | ORAL_TABLET | Freq: Every day | ORAL | Status: DC

## 2011-07-04 NOTE — Progress Notes (Signed)
  Subjective:    Patient ID: Lacey Sullivan, female    DOB: October 26, 1970, 41 y.o.   MRN: [id number]  HPI Here for med refills. Her husband got a job in [place], and she will be moving in 2 weeks. She is doing well with no complaints.    Review of Systems  Constitutional: Negative.   Respiratory: Negative.   Cardiovascular: Negative.   Musculoskeletal: Positive for back pain.  Psychiatric/Behavioral: Negative.        Objective:   Physical Exam  Constitutional: She appears well-developed and well-nourished.  Cardiovascular: Normal rate, regular rhythm, normal heart sounds and intact distal pulses.   Pulmonary/Chest: Effort normal and breath sounds normal.          Assessment & Plan:  Doing well. Meds refilled

## 2011-12-11 ENCOUNTER — Other Ambulatory Visit: Payer: Self-pay | Admitting: Family Medicine

## 2011-12-11 NOTE — Telephone Encounter (Signed)
Call in #180 with one rf  

## 2011-12-11 NOTE — Telephone Encounter (Signed)
Pt need generic xanax 1mg  twice a day #180 with  refills sent to [facility].

## 2011-12-12 MED ORDER — ALPRAZOLAM 1 MG PO TABS: 1.0000 mg | ORAL_TABLET | Freq: Two times a day (BID) | ORAL | Status: DC | PRN

## 2011-12-12 NOTE — Telephone Encounter (Signed)
I printed script and faxed. 

## 2012-07-30 ENCOUNTER — Encounter: Payer: Self-pay | Admitting: Family Medicine

## 2012-07-30 ENCOUNTER — Ambulatory Visit (INDEPENDENT_AMBULATORY_CARE_PROVIDER_SITE_OTHER): Payer: Managed Care, Other (non HMO) | Admitting: Family Medicine

## 2012-07-30 VITALS — BP 114/62 | HR 123 | Temp 98.4°F | Wt 234.0 lb

## 2012-07-30 DIAGNOSIS — M545 Low back pain, unspecified: Secondary | ICD-10-CM

## 2012-07-30 DIAGNOSIS — F329 Major depressive disorder, single episode, unspecified: Secondary | ICD-10-CM

## 2012-07-30 DIAGNOSIS — G2581 Restless legs syndrome: Secondary | ICD-10-CM

## 2012-07-30 DIAGNOSIS — G4700 Insomnia, unspecified: Secondary | ICD-10-CM

## 2012-07-30 MED ORDER — ALPRAZOLAM 1 MG PO TABS: 1.0000 mg | ORAL_TABLET | Freq: Two times a day (BID) | ORAL | Status: DC | PRN

## 2012-07-30 MED ORDER — BUPROPION HCL ER (XL) 300 MG PO TB24: 300.0000 mg | ORAL_TABLET | ORAL | Status: DC

## 2012-07-30 MED ORDER — PHENTERMINE HCL 37.5 MG PO CAPS: 37.5000 mg | ORAL_CAPSULE | ORAL | Status: DC

## 2012-07-30 MED ORDER — ROPINIROLE HCL 1 MG PO TABS: 2.0000 mg | ORAL_TABLET | Freq: Every day | ORAL | Status: DC

## 2012-07-30 NOTE — Progress Notes (Signed)
  Subjective:    Patient ID: Lacey Sullivan, female    DOB: [birth year], 42 y.o.   MRN: [id number]  HPI Here to follow up. She had moved to [place] last year briefly, but now she is living in [place], [place] due to another job change for her husband. They hope to stay there long term now. She has been feeling well and is happly with her meds. She asks about a med to help her lose weight. She is starting to walk for exercise.    Review of Systems  Constitutional: Negative.   Respiratory: Negative.   Cardiovascular: Negative.   Neurological: Negative.   Psychiatric/Behavioral: Negative.        Objective:   Physical Exam  Constitutional: She appears well-developed and well-nourished.  Cardiovascular: Normal rate, regular rhythm, normal heart sounds and intact distal pulses.   Pulmonary/Chest: Effort normal and breath sounds normal.          Assessment & Plan:  We refilled her meds. Try Phenetermine

## 2013-02-09 ENCOUNTER — Other Ambulatory Visit: Payer: Self-pay | Admitting: Obstetrics and Gynecology

## 2013-02-23 ENCOUNTER — Other Ambulatory Visit: Payer: Self-pay | Admitting: Family Medicine

## 2013-02-25 NOTE — Telephone Encounter (Signed)
Call in Xanax #180 with one rf. NO refills on phentermine, she needs to take a break from this for awhile.

## 2013-02-25 NOTE — Telephone Encounter (Signed)
Last OV and fill date 6.6.2014.

## 2013-02-26 ENCOUNTER — Other Ambulatory Visit: Payer: Self-pay | Admitting: Family Medicine

## 2013-02-28 ENCOUNTER — Telehealth: Payer: Self-pay | Admitting: Family Medicine

## 2013-02-28 MED ORDER — PHENTERMINE HCL 37.5 MG PO CAPS: 37.5000 mg | ORAL_CAPSULE | ORAL | Status: DC

## 2013-02-28 NOTE — Telephone Encounter (Signed)
Per Dr. Clent Ridges  okay to call in Phentermine, pt stated that she has not taken this medication in a month or so. I did call in script.

## 2013-04-21 ENCOUNTER — Other Ambulatory Visit: Payer: Self-pay | Admitting: Family Medicine

## 2013-04-22 MED ORDER — ROPINIROLE HCL 1 MG PO TABS: ORAL_TABLET | ORAL | Status: DC

## 2013-04-22 NOTE — Addendum Note (Signed)
Addended by: Kern Reap B on: 04/22/2013 09:56 AM   Modules accepted: Orders

## 2013-05-17 ENCOUNTER — Encounter: Payer: Self-pay | Admitting: Family

## 2013-05-17 ENCOUNTER — Ambulatory Visit (INDEPENDENT_AMBULATORY_CARE_PROVIDER_SITE_OTHER): Payer: BC Managed Care – PPO | Admitting: Family

## 2013-05-17 VITALS — BP 118/80 | HR 107 | Wt 219.0 lb

## 2013-05-17 DIAGNOSIS — R300 Dysuria: Secondary | ICD-10-CM

## 2013-05-17 DIAGNOSIS — N390 Urinary tract infection, site not specified: Secondary | ICD-10-CM

## 2013-05-17 LAB — POCT URINALYSIS DIPSTICK
Bilirubin, UA: NEGATIVE
Glucose, UA: NEGATIVE
Ketones, UA: NEGATIVE
NITRITE UA: POSITIVE
PH UA: 5.5
Spec Grav, UA: 1.01
UROBILINOGEN UA: 0.2

## 2013-05-17 MED ORDER — SULFAMETHOXAZOLE-TRIMETHOPRIM 800-160 MG PO TABS: 1.0000 | ORAL_TABLET | Freq: Two times a day (BID) | ORAL | Status: AC

## 2013-05-17 NOTE — Patient Instructions (Signed)
Urinary Tract Infection  Urinary tract infections (UTIs) can develop anywhere along your urinary tract. Your urinary tract is your body's drainage system for removing wastes and extra water. Your urinary tract includes two kidneys, two ureters, a bladder, and a urethra. Your kidneys are a pair of bean-shaped organs. Each kidney is about the size of your fist. They are located below your ribs, one on each side of your spine.  CAUSES  Infections are caused by microbes, which are microscopic organisms, including fungi, viruses, and bacteria. These organisms are so small that they can only be seen through a microscope. Bacteria are the microbes that most commonly cause UTIs.  SYMPTOMS   Symptoms of UTIs may vary by age and gender of the patient and by the location of the infection. Symptoms in young women typically include a frequent and intense urge to urinate and a painful, burning feeling in the bladder or urethra during urination. Older women and men are more likely to be tired, shaky, and weak and have muscle aches and abdominal pain. A fever may mean the infection is in your kidneys. Other symptoms of a kidney infection include pain in your back or sides below the ribs, nausea, and vomiting.  DIAGNOSIS  To diagnose a UTI, your caregiver will ask you about your symptoms. Your caregiver also will ask to provide a urine sample. The urine sample will be tested for bacteria and white blood cells. White blood cells are made by your body to help fight infection.  TREATMENT   Typically, UTIs can be treated with medication. Because most UTIs are caused by a bacterial infection, they usually can be treated with the use of antibiotics. The choice of antibiotic and length of treatment depend on your symptoms and the type of bacteria causing your infection.  HOME CARE INSTRUCTIONS   If you were prescribed antibiotics, take them exactly as your caregiver instructs you. Finish the medication even if you feel better after you  have only taken some of the medication.   Drink enough water and fluids to keep your urine clear or pale yellow.   Avoid caffeine, tea, and carbonated beverages. They tend to irritate your bladder.   Empty your bladder often. Avoid holding urine for long periods of time.   Empty your bladder before and after sexual intercourse.   After a bowel movement, women should cleanse from front to back. Use each tissue only once.  SEEK MEDICAL CARE IF:    You have back pain.   You develop a fever.   Your symptoms do not begin to resolve within 3 days.  SEEK IMMEDIATE MEDICAL CARE IF:    You have severe back pain or lower abdominal pain.   You develop chills.   You have nausea or vomiting.   You have continued burning or discomfort with urination.  MAKE SURE YOU:    Understand these instructions.   Will watch your condition.   Will get help right away if you are not doing well or get worse.  Document Released: 11/20/2004 Document Revised: 08/12/2011 Document Reviewed: 03/21/2011  [employer] Patient Information 2014 [employer].

## 2013-05-17 NOTE — Progress Notes (Signed)
Pre visit review using our clinic review tool, if applicable. No additional management support is needed unless otherwise documented below in the visit note. 

## 2013-05-17 NOTE — Progress Notes (Signed)
   Subjective:    Patient ID: Lacey Sullivan, female    DOB: 22-May-1970, 43 y.o.   MRN: [id number]  HPI 43 year old caucasian female, nonsmoker presenting today for burning on urination, frequent urination, lower back pain, and blood in urine. Symptoms began five days ago.  She took AZO two days ago and back pain has subsided.  She has decreased caffeine intake and increased water intake.    Review of Systems  Constitutional: Negative.   Respiratory: Negative.   Cardiovascular: Negative.   Gastrointestinal: Negative.   Genitourinary: Positive for dysuria, urgency, hematuria, flank pain and decreased urine volume. Negative for menstrual problem and pelvic pain.  Musculoskeletal: Positive for back pain.  Hematological: Negative.        Objective:   Physical Exam  Constitutional: She is oriented to person, place, and time. She appears well-developed and well-nourished.  Cardiovascular: Normal rate, regular rhythm and normal heart sounds.   Pulmonary/Chest: Effort normal and breath sounds normal.  Abdominal: Soft. Bowel sounds are normal. She exhibits no distension. There is no tenderness. There is no rebound.  Musculoskeletal: Normal range of motion.  Neurological: She is alert and oriented to person, place, and time.  Skin: Skin is warm and dry.  UA collected.       Assessment & Plan:  Assessment 1. Urinary Tract Infection  Plan 1. Bactrim 160/800mg  PO 1 tab BID.  2. Increase water intake.  3.  Continue current medications. 4.  Contact office for questions or concerns.

## 2013-05-18 ENCOUNTER — Telehealth: Payer: Self-pay | Admitting: Family Medicine

## 2013-05-18 NOTE — Telephone Encounter (Signed)
Relevant patient education mailed to patient.  

## 2013-05-20 LAB — URINE CULTURE: Colony Count: >=100000

## 2013-07-26 ENCOUNTER — Other Ambulatory Visit: Payer: Self-pay | Admitting: Orthopaedic Surgery

## 2013-07-26 DIAGNOSIS — M546 Pain in thoracic spine: Secondary | ICD-10-CM

## 2013-07-27 ENCOUNTER — Other Ambulatory Visit: Payer: BC Managed Care – PPO

## 2013-08-02 ENCOUNTER — Ambulatory Visit
Admission: RE | Admit: 2013-08-02 | Discharge: 2013-08-02 | Disposition: A | Discharge status: Home / Self Care | Payer: 59 | Source: Ambulatory Visit | Attending: Orthopaedic Surgery | Admitting: Orthopaedic Surgery

## 2013-08-02 DIAGNOSIS — M546 Pain in thoracic spine: Secondary | ICD-10-CM

## 2013-08-22 ENCOUNTER — Encounter: Payer: Self-pay | Admitting: Family Medicine

## 2013-08-22 ENCOUNTER — Ambulatory Visit (INDEPENDENT_AMBULATORY_CARE_PROVIDER_SITE_OTHER): Payer: PRIVATE HEALTH INSURANCE | Admitting: Family Medicine

## 2013-08-22 VITALS — BP 112/85 | HR 105 | Temp 99.0°F | Ht 66.0 in | Wt 218.0 lb

## 2013-08-22 DIAGNOSIS — F329 Major depressive disorder, single episode, unspecified: Secondary | ICD-10-CM

## 2013-08-22 DIAGNOSIS — M545 Low back pain, unspecified: Secondary | ICD-10-CM

## 2013-08-22 DIAGNOSIS — G2581 Restless legs syndrome: Secondary | ICD-10-CM

## 2013-08-22 DIAGNOSIS — G5600 Carpal tunnel syndrome, unspecified upper limb: Secondary | ICD-10-CM

## 2013-08-22 DIAGNOSIS — F3289 Other specified depressive episodes: Secondary | ICD-10-CM

## 2013-08-22 DIAGNOSIS — M150 Primary generalized (osteo)arthritis: Secondary | ICD-10-CM

## 2013-08-22 DIAGNOSIS — M1990 Unspecified osteoarthritis, unspecified site: Secondary | ICD-10-CM | POA: Insufficient documentation

## 2013-08-22 DIAGNOSIS — G5603 Carpal tunnel syndrome, bilateral upper limbs: Secondary | ICD-10-CM

## 2013-08-22 DIAGNOSIS — M8949 Other hypertrophic osteoarthropathy, multiple sites: Secondary | ICD-10-CM

## 2013-08-22 DIAGNOSIS — M159 Polyosteoarthritis, unspecified: Secondary | ICD-10-CM

## 2013-08-22 MED ORDER — ROPINIROLE HCL 1 MG PO TABS: ORAL_TABLET | ORAL | Status: DC

## 2013-08-22 MED ORDER — ALPRAZOLAM 1 MG PO TABS: 1.0000 mg | ORAL_TABLET | Freq: Every evening | ORAL | Status: DC | PRN

## 2013-08-22 MED ORDER — BUPROPION HCL ER (XL) 300 MG PO TB24: ORAL_TABLET | ORAL | Status: DC

## 2013-08-22 NOTE — Progress Notes (Signed)
Pre visit review using our clinic review tool, if applicable. No additional management support is needed unless otherwise documented below in the visit note. 

## 2013-08-22 NOTE — Progress Notes (Signed)
   Subjective:    Patient ID: Lacey Sullivan, female    DOB: [birth year], 43 y.o.   MRN: [id number]  HPI Here to follow up. She is doing well in general. Her depression and anxiety are stable. Her restless legs are controlled. She does mention stiffness and pain in all her fingers, and she gets aching pains and numbness in both hands especially at night. 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    Review of Systems  Constitutional: Negative.   Respiratory: Negative.   Cardiovascular: Negative.   Musculoskeletal: Positive for arthralgias. Negative for joint swelling.  Neurological: Positive for numbness.  Psychiatric/Behavioral: Negative.        Objective:   Physical Exam  Constitutional: She appears well-developed and well-nourished.  Cardiovascular: Normal rate, regular rhythm, normal heart sounds and intact distal pulses.   Pulmonary/Chest: Effort normal and breath sounds normal.  Musculoskeletal: Normal range of motion. She exhibits no edema and no tenderness.  She has small Heberdens and Bouchards nodes.           Assessment & Plan:  Refilled meds. Try glucosamine OTC for the arthritis. For the carpal tunnel she will wear wrist splints at night

## 2013-08-23 ENCOUNTER — Telehealth: Payer: Self-pay | Admitting: Family Medicine

## 2013-08-23 NOTE — Telephone Encounter (Signed)
Relevant patient education assigned to patient using Emmi. ° °

## 2014-02-19 ENCOUNTER — Other Ambulatory Visit: Payer: Self-pay | Admitting: Family Medicine

## 2014-02-21 NOTE — Telephone Encounter (Signed)
Call in #90 with one rf 

## 2014-04-19 ENCOUNTER — Other Ambulatory Visit: Payer: Self-pay | Admitting: Obstetrics and Gynecology

## 2014-04-20 LAB — CYTOLOGY - PAP

## 2014-08-02 ENCOUNTER — Ambulatory Visit (INDEPENDENT_AMBULATORY_CARE_PROVIDER_SITE_OTHER): Payer: 59 | Admitting: Family Medicine

## 2014-08-02 ENCOUNTER — Encounter: Payer: Self-pay | Admitting: Family Medicine

## 2014-08-02 VITALS — BP 110/60 | HR 111 | Temp 98.3°F | Wt 211.2 lb

## 2014-08-02 DIAGNOSIS — F329 Major depressive disorder, single episode, unspecified: Secondary | ICD-10-CM

## 2014-08-02 DIAGNOSIS — G2581 Restless legs syndrome: Secondary | ICD-10-CM

## 2014-08-02 DIAGNOSIS — G4700 Insomnia, unspecified: Secondary | ICD-10-CM

## 2014-08-02 DIAGNOSIS — F32A Depression, unspecified: Secondary | ICD-10-CM

## 2014-08-02 MED ORDER — ALPRAZOLAM 1 MG PO TABS: ORAL_TABLET | ORAL | Status: DC

## 2014-08-02 MED ORDER — ROPINIROLE HCL 2 MG PO TABS: 2.0000 mg | ORAL_TABLET | Freq: Every day | ORAL | Status: DC

## 2014-08-02 MED ORDER — BUPROPION HCL ER (XL) 300 MG PO TB24: ORAL_TABLET | ORAL | Status: DC

## 2014-08-02 NOTE — Progress Notes (Signed)
Pre visit review using our clinic review tool, if applicable. No additional management support is needed unless otherwise documented below in the visit note. 

## 2014-08-02 NOTE — Progress Notes (Signed)
   Subjective:    Patient ID: Lacey Sullivan, female    DOB: [birth year], 44 y.o.   MRN: [id number]  HPI Here for refills on meds. She is doing well and has no concerns. Her restless legs are well controlled and she sleeps well. Her husband's job transferred him to [place], [place] and they will be moving there next week.    Review of Systems  Constitutional: Negative.   Respiratory: Negative.   Cardiovascular: Negative.   Neurological: Negative.        Objective:   Physical Exam  Constitutional: She is oriented to person, place, and time. She appears well-developed and well-nourished.  Cardiovascular: Normal rate, regular rhythm, normal heart sounds and intact distal pulses.   Pulmonary/Chest: Effort normal and breath sounds normal.  Neurological: She is alert and oriented to person, place, and time.          Assessment & Plan:  She is doing well. meds were refilled.

## 2015-03-04 ENCOUNTER — Other Ambulatory Visit: Payer: Self-pay | Admitting: Family Medicine

## 2015-03-05 NOTE — Telephone Encounter (Signed)
[facility] [place]

## 2015-03-05 NOTE — Telephone Encounter (Signed)
Call in #90 with one rf 

## 2015-06-18 ENCOUNTER — Encounter: Payer: Self-pay | Admitting: Family Medicine

## 2015-06-18 ENCOUNTER — Ambulatory Visit (INDEPENDENT_AMBULATORY_CARE_PROVIDER_SITE_OTHER): Payer: 59 | Admitting: Family Medicine

## 2015-06-18 VITALS — BP 103/67 | HR 108 | Temp 98.7°F | Ht 66.0 in | Wt 219.0 lb

## 2015-06-18 DIAGNOSIS — M069 Rheumatoid arthritis, unspecified: Secondary | ICD-10-CM | POA: Insufficient documentation

## 2015-06-18 DIAGNOSIS — F329 Major depressive disorder, single episode, unspecified: Secondary | ICD-10-CM

## 2015-06-18 DIAGNOSIS — G4700 Insomnia, unspecified: Secondary | ICD-10-CM

## 2015-06-18 DIAGNOSIS — M05761 Rheumatoid arthritis with rheumatoid factor of right knee without organ or systems involvement: Secondary | ICD-10-CM

## 2015-06-18 DIAGNOSIS — M05762 Rheumatoid arthritis with rheumatoid factor of left knee without organ or systems involvement: Secondary | ICD-10-CM

## 2015-06-18 DIAGNOSIS — G2581 Restless legs syndrome: Secondary | ICD-10-CM

## 2015-06-18 DIAGNOSIS — F32A Depression, unspecified: Secondary | ICD-10-CM

## 2015-06-18 MED ORDER — ROPINIROLE HCL 2 MG PO TABS: 4.0000 mg | ORAL_TABLET | Freq: Every day | ORAL | Status: DC

## 2015-06-18 NOTE — Progress Notes (Signed)
   Subjective:    Patient ID: Lacey Sullivan, female    DOB: 1970-11-25, 45 y.o.   MRN: [id number]  HPI Here to follow up. She now lives in [place], [place] and she is happy there. She still comes to [place] several times a year to visit friends. She was diagnosed with RA last Decemeber, and this manifests primarily with pain and swelling in the knees and the ankles. She is currently taking Methotrexate but she is waiting for prior authorization to approve taking Enbrel. Her depression is stable and she is sleeping well. Her restless legs have been acting up a bit, and she sometimes has to take a second Ropinirole to get through the night.    Review of Systems  Constitutional: Negative.   Respiratory: Negative.   Cardiovascular: Negative.   Musculoskeletal: Positive for joint swelling and arthralgias.  Neurological: Negative.   Psychiatric/Behavioral: Negative.        Objective:   Physical Exam  Constitutional: She is oriented to person, place, and time. She appears well-developed and well-nourished.  Neck: No thyromegaly present.  Cardiovascular: Normal rate, regular rhythm, normal heart sounds and intact distal pulses.   Pulmonary/Chest: Effort normal and breath sounds normal.  Lymphadenopathy:    She has no cervical adenopathy.  Neurological: She is alert and oriented to person, place, and time.  Psychiatric: She has a normal mood and affect. Her behavior is normal. Thought content normal.          Assessment & Plan:  Her depression is stable. Her insomnia is stable. Her restless legs have worsened a bit, so we will increase her bedtime Ropinirole to a total of 4 mg. Her RA is treated by her Rheumatologist in [place].  Nelwyn Salisbury, MD

## 2015-06-18 NOTE — Progress Notes (Signed)
Pre visit review using our clinic review tool, if applicable. No additional management support is needed unless otherwise documented below in the visit note. 

## 2015-08-13 ENCOUNTER — Other Ambulatory Visit: Payer: Self-pay | Admitting: Family Medicine

## 2015-08-20 ENCOUNTER — Other Ambulatory Visit: Payer: Self-pay | Admitting: Family Medicine

## 2015-09-23 ENCOUNTER — Other Ambulatory Visit: Payer: Self-pay | Admitting: Family Medicine

## 2015-09-24 NOTE — Telephone Encounter (Signed)
Call in #90 with one rf 

## 2015-09-25 ENCOUNTER — Telehealth: Payer: Self-pay | Admitting: Family Medicine

## 2015-09-25 MED ORDER — ALPRAZOLAM 1 MG PO TABS: ORAL_TABLET | ORAL | 5 refills | Status: DC

## 2015-09-25 NOTE — Telephone Encounter (Signed)
Pharm need DEA # and they are not able to dispense the 90 days but are able to dispense 30 days.  Pls call Pharm with info.

## 2015-09-25 NOTE — Telephone Encounter (Signed)
I called in a 30 day supply with refills the DEA number, advised pharmacy to cancel 90 day supply order.

## 2015-10-18 ENCOUNTER — Other Ambulatory Visit: Payer: Self-pay | Admitting: Family Medicine

## 2015-10-18 NOTE — Telephone Encounter (Signed)
Pt trying to stop smoking and would like a rx for Chantix  [id number] - [place], [place] - [address]

## 2015-10-19 MED ORDER — VARENICLINE TARTRATE 1 MG PO TABS: 1.0000 mg | ORAL_TABLET | Freq: Two times a day (BID) | ORAL | 1 refills | Status: DC

## 2015-10-19 MED ORDER — VARENICLINE TARTRATE 0.5 MG X 11 & 1 MG X 42 PO MISC: ORAL | 0 refills | Status: DC

## 2015-10-19 NOTE — Telephone Encounter (Signed)
Call in Chantix #1 starting pack, also #1 Continuing pack with one rf (3 months total)

## 2015-10-22 MED ORDER — VARENICLINE TARTRATE 0.5 MG X 11 & 1 MG X 42 PO MISC: ORAL | 0 refills | Status: DC

## 2015-10-22 MED ORDER — VARENICLINE TARTRATE 1 MG PO TABS: 1.0000 mg | ORAL_TABLET | Freq: Two times a day (BID) | ORAL | 1 refills | Status: DC

## 2015-10-22 NOTE — Addendum Note (Signed)
Addended by: Aniceto Boss A on: 10/22/2015 04:15 PM   Modules accepted: Orders

## 2015-10-22 NOTE — Telephone Encounter (Signed)
I printed both scripts and [facility], also left a voice message for pt with this information.

## 2015-10-23 ENCOUNTER — Encounter: Payer: Self-pay | Admitting: Family Medicine

## 2015-10-23 ENCOUNTER — Telehealth: Payer: Self-pay | Admitting: Family Medicine

## 2015-10-23 DIAGNOSIS — Z720 Tobacco use: Secondary | ICD-10-CM | POA: Insufficient documentation

## 2015-10-23 NOTE — Telephone Encounter (Signed)
Prior authorization for Chantix submitted by telephone.  Waiting on approval or denial.

## 2015-11-01 NOTE — Telephone Encounter (Signed)
Received a fax from the pharmacy.  Chantix has been denied.  It can only be approved if all of the following is met...  (1)  Your patient has a history of failure, contraindication, or intolerance to one of the following:  Habitrol OTC  Nicoderm CQ OTC  Nicorette gum OTC  Nicorette lozenge OTC  Nicorette Mini-Lozenge OTC  Thrive Gum OTC  Thrive Lozenge OTC  (2)  Your patient is not currently taking nicotine replacement therapy  (3)  Your patient is not currently taking bupropion for tobacco cessation   Please advise what you would like for the pt to try.

## 2015-11-02 NOTE — Telephone Encounter (Signed)
We have paperwork on this already, waiting to re submit.

## 2015-11-02 NOTE — Telephone Encounter (Signed)
Tell them she takes Bupropion for depression not smoking cessation

## 2015-11-16 ENCOUNTER — Telehealth: Payer: Self-pay

## 2015-11-16 NOTE — Telephone Encounter (Signed)
PA for Chantix was denied because member has not tried an OTC medication to help quit smoking. Once she does, we can resubmit the PA.

## 2015-11-16 NOTE — Telephone Encounter (Signed)
I left a voice message with below information. 

## 2015-12-01 ENCOUNTER — Other Ambulatory Visit: Payer: Self-pay | Admitting: Family Medicine

## 2015-12-07 ENCOUNTER — Telehealth: Payer: Self-pay

## 2015-12-07 NOTE — Telephone Encounter (Signed)
PA for Chantix submitted via form from insurance company.

## 2016-01-01 NOTE — Telephone Encounter (Signed)
Called to check status on PA. Insurance is still working on approval or denial.

## 2016-03-27 ENCOUNTER — Other Ambulatory Visit: Payer: Self-pay | Admitting: Family Medicine

## 2016-03-31 NOTE — Telephone Encounter (Signed)
Call in #30 with 5 rf 

## 2016-06-19 ENCOUNTER — Other Ambulatory Visit: Payer: Self-pay | Admitting: Family Medicine

## 2016-07-16 ENCOUNTER — Other Ambulatory Visit: Payer: Self-pay | Admitting: Family Medicine

## 2016-09-09 ENCOUNTER — Ambulatory Visit (INDEPENDENT_AMBULATORY_CARE_PROVIDER_SITE_OTHER): Payer: 59 | Admitting: Family Medicine

## 2016-09-09 ENCOUNTER — Encounter: Payer: Self-pay | Admitting: Family Medicine

## 2016-09-09 VITALS — BP 115/88 | HR 98 | Temp 99.3°F | Ht 66.0 in | Wt 241.0 lb

## 2016-09-09 DIAGNOSIS — M069 Rheumatoid arthritis, unspecified: Secondary | ICD-10-CM | POA: Diagnosis not present

## 2016-09-09 DIAGNOSIS — F5101 Primary insomnia: Secondary | ICD-10-CM

## 2016-09-09 DIAGNOSIS — J209 Acute bronchitis, unspecified: Secondary | ICD-10-CM

## 2016-09-09 DIAGNOSIS — F325 Major depressive disorder, single episode, in full remission: Secondary | ICD-10-CM

## 2016-09-09 MED ORDER — ALPRAZOLAM 1 MG PO TABS: 1.0000 mg | ORAL_TABLET | Freq: Every day | ORAL | 1 refills | Status: DC

## 2016-09-09 MED ORDER — AZITHROMYCIN 250 MG PO TABS: ORAL_TABLET | ORAL | 0 refills | Status: DC

## 2016-09-09 MED ORDER — ROPINIROLE HCL 2 MG PO TABS: 4.0000 mg | ORAL_TABLET | Freq: Every day | ORAL | 3 refills | Status: DC

## 2016-09-09 NOTE — Patient Instructions (Signed)
WE NOW OFFER   [facility] FAST TRACK!!!  SAME DAY Appointments for ACUTE CARE  Such as: Sprains, Injuries, cuts, abrasions, rashes, muscle pain, joint pain, back pain Colds, flu, sore throats, headache, allergies, cough, fever  Ear pain, sinus and eye infections Abdominal pain, nausea, vomiting, diarrhea, upset stomach Animal/insect bites  3 Easy Ways to Schedule: Walk-In Scheduling Call in scheduling Mychart Sign-up: [email or web address]         

## 2016-09-09 NOTE — Progress Notes (Signed)
   Subjective:    Patient ID: Lacey Sullivan, female    DOB: 05/06/1970, 46 y.o.   MRN: [id number]  HPI Here for follow up. She has been living in [place] but her husband is being transferred back to [place]. She has been doing well until 3 days ago when she developed a dry cough, ST, and a fever.   Review of Systems  Constitutional: Positive for fever.  HENT: Positive for congestion, postnasal drip, sinus pain and sinus pressure.   Respiratory: Positive for cough.   Cardiovascular: Negative.   Neurological: Negative.   Psychiatric/Behavioral: Negative.        Objective:   Physical Exam  Constitutional: She appears well-developed and well-nourished.  HENT:  Right Ear: External ear normal.  Left Ear: External ear normal.  Nose: Nose normal.  Mouth/Throat: Oropharynx is clear and moist.  Eyes: Conjunctivae are normal.  Neck: No thyromegaly present.  Pulmonary/Chest: Effort normal and breath sounds normal. No respiratory distress. She has no wheezes. She has no rales.  Lymphadenopathy:    She has no cervical adenopathy.          Assessment & Plan:  She has an early bronchitis so we will treat this with a Zpack. Her insomnia and restless legs are stable. Her depression is stable. When she moves back to [place] we will refer her to a rheumatologist for her RA.  Gershon Crane, MD

## 2016-11-13 ENCOUNTER — Encounter: Payer: Self-pay | Admitting: Family Medicine

## 2016-11-25 ENCOUNTER — Encounter: Payer: Self-pay | Admitting: Family Medicine

## 2016-11-25 ENCOUNTER — Telehealth: Payer: Self-pay | Admitting: Family Medicine

## 2016-11-25 DIAGNOSIS — M05761 Rheumatoid arthritis with rheumatoid factor of right knee without organ or systems involvement: Secondary | ICD-10-CM

## 2016-11-25 DIAGNOSIS — M05762 Rheumatoid arthritis with rheumatoid factor of left knee without organ or systems involvement: Principal | ICD-10-CM

## 2016-11-25 NOTE — Telephone Encounter (Signed)
I did a referral to Dr. Alben Deeds

## 2016-11-25 NOTE — Telephone Encounter (Signed)
Pt has RA and would like a referral to rheumatologist dr fry sees. Pt has [facility]

## 2016-11-26 NOTE — Telephone Encounter (Signed)
I spoke with pt and gave below message.  

## 2017-01-02 DIAGNOSIS — Z803 Family history of malignant neoplasm of breast: Secondary | ICD-10-CM | POA: Insufficient documentation

## 2017-01-02 DIAGNOSIS — R030 Elevated blood-pressure reading, without diagnosis of hypertension: Secondary | ICD-10-CM | POA: Insufficient documentation

## 2017-01-02 DIAGNOSIS — E669 Obesity, unspecified: Secondary | ICD-10-CM | POA: Insufficient documentation

## 2017-01-24 DIAGNOSIS — L989 Disorder of the skin and subcutaneous tissue, unspecified: Secondary | ICD-10-CM | POA: Insufficient documentation

## 2017-02-11 ENCOUNTER — Ambulatory Visit: Payer: 59 | Admitting: Family Medicine

## 2017-02-11 ENCOUNTER — Encounter: Payer: Self-pay | Admitting: Family Medicine

## 2017-02-11 VITALS — BP 108/70 | HR 105 | Temp 98.9°F | Wt 232.4 lb

## 2017-02-11 DIAGNOSIS — L309 Dermatitis, unspecified: Secondary | ICD-10-CM | POA: Diagnosis not present

## 2017-02-11 MED ORDER — PHENTERMINE HCL 37.5 MG PO CAPS: 37.5000 mg | ORAL_CAPSULE | ORAL | 1 refills | Status: DC

## 2017-02-11 MED ORDER — TRIAMCINOLONE ACETONIDE 0.1 % EX CREA: 1.0000 "application " | TOPICAL_CREAM | Freq: Three times a day (TID) | CUTANEOUS | 5 refills | Status: DC

## 2017-02-11 NOTE — Progress Notes (Signed)
   Subjective:    Patient ID: Lacey Sullivan, female    DOB: 05/31/1970, 46 y.o.   MRN: [id number]  HPI Here for 2 things. First she has had 2 weeks of itchy raw skin on her hands. OTC creams do not help. Also she asks for help with losing weight. She wants to try an appetite suppressant. She is already on Wellbutrin which may help with this a little. She is exercising.    Review of Systems  Constitutional: Negative.   Respiratory: Negative.   Cardiovascular: Negative.   Skin: Positive for rash.       Objective:   Physical Exam  Constitutional: She appears well-developed and well-nourished.  Cardiovascular: Normal rate, regular rhythm, normal heart sounds and intact distal pulses.  Pulmonary/Chest: Effort normal and breath sounds normal. No respiratory distress. She has no wheezes. She has no rales.  Skin:  Both hands and most of her fingers have patches of red, scaly skin which is cracking open          Assessment & Plan:  For the obesity she will try Phentermine daily. For the eczema try Triamcinolone cream 2-3 times a day.  Gershon Crane, MD

## 2017-04-23 ENCOUNTER — Other Ambulatory Visit: Payer: Self-pay | Admitting: Family Medicine

## 2017-04-24 NOTE — Telephone Encounter (Signed)
Call in #90 with one rf 

## 2017-04-24 NOTE — Telephone Encounter (Signed)
Last OV: 02/11/2017 Last filled: 09/09/2016, #90, 1 RF Sig: Take 1 tablet (1 mg total) by mouth at bedtime  UDS: Not on file within the last year

## 2017-04-24 NOTE — Telephone Encounter (Signed)
Rx/forms faxed. Fax confirmation received.  

## 2017-06-01 ENCOUNTER — Ambulatory Visit: Payer: 59 | Admitting: Family Medicine

## 2017-06-01 ENCOUNTER — Encounter: Payer: Self-pay | Admitting: Family Medicine

## 2017-06-01 VITALS — BP 120/78 | HR 95 | Temp 98.5°F | Ht 66.0 in | Wt 208.2 lb

## 2017-06-01 DIAGNOSIS — L24A9 Irritant contact dermatitis due friction or contact with other specified body fluids: Secondary | ICD-10-CM

## 2017-06-01 DIAGNOSIS — L02429 Furuncle of limb, unspecified: Secondary | ICD-10-CM

## 2017-06-01 DIAGNOSIS — T148XXA Other injury of unspecified body region, initial encounter: Secondary | ICD-10-CM | POA: Diagnosis not present

## 2017-06-01 MED ORDER — DOXYCYCLINE HYCLATE 100 MG PO CAPS: 100.0000 mg | ORAL_CAPSULE | Freq: Two times a day (BID) | ORAL | 0 refills | Status: AC

## 2017-06-01 NOTE — Progress Notes (Signed)
   Subjective:    Patient ID: Lacey Sullivan, female    DOB: 1970/12/23, 47 y.o.   MRN: [id number]  HPI Here for one month of a tender boil on the left thigh. This started as a small pimple but has grown much larger. Several days ago it burst open and it drained pus and blood for a day. No fevers.    Review of Systems  Constitutional: Negative.   Respiratory: Negative.   Cardiovascular: Negative.   Skin: Positive for wound.       Objective:   Physical Exam  Constitutional: She appears well-developed and well-nourished.  Cardiovascular: Normal rate, regular rhythm, normal heart sounds and intact distal pulses.  Pulmonary/Chest: Effort normal and breath sounds normal. No respiratory distress. She has no wheezes. She has no rales.  Skin:  The proximal inner left thigh has a small skin opening that is about 0.5 cm deep and 0.5 cm across. There is a small amount of purulent fluid inside. A sample of this was collected for culture           Assessment & Plan:  Boil, cleanse with soap and water daily in the shower. Given Doxycycline for 10 days.  Gershon Crane, MD

## 2017-06-04 ENCOUNTER — Telehealth: Payer: Self-pay | Admitting: Family Medicine

## 2017-06-04 ENCOUNTER — Other Ambulatory Visit: Payer: Self-pay | Admitting: Family Medicine

## 2017-06-04 LAB — WOUND CULTURE
MICRO NUMBER:: 90429792
SPECIMEN QUALITY: ADEQUATE

## 2017-06-04 NOTE — Telephone Encounter (Signed)
Copied from CRM [id number]. Topic: Quick Communication - See Telephone Encounter >> Jun 04, 2017 10:29 AM Trula Slade wrote: CRM for notification. See Telephone encounter for: 06/04/17. [facility] [address] [phone] wanted to inform the provider that the original prescription that was written for ALPRAZolam Prudy Feeler) 1 MG tablet on 04/24/17 was filled at 0.25mg  instead of 1mg , and they apologize for that.  The patient has not taken any of the 0.25mg  and the pharmacy would like to know if they can refill at the original 1mg  dosage.

## 2017-06-04 NOTE — Telephone Encounter (Signed)
Sent to PCP to advise 

## 2017-06-05 NOTE — Telephone Encounter (Signed)
Yes have them fill the 1 mg rx

## 2017-06-05 NOTE — Telephone Encounter (Signed)
Called and spoke with the pharmacist. Gave them the OK to fill the 1 MG tablet for the pt.

## 2017-06-09 ENCOUNTER — Telehealth: Payer: Self-pay | Admitting: Family Medicine

## 2017-06-09 NOTE — Telephone Encounter (Signed)
Copied from CRM [id number]. Topic: Quick Communication - Lab Results >> Jun 09, 2017  4:49 PM Gracelyn Nurse, [place] wrote: Called patient to inform them of  lab results. When patient returns call, triage nurse may disclose results.   cb is [phone]

## 2017-06-10 NOTE — Telephone Encounter (Signed)
Patient notified of her results- see lab note.

## 2017-07-10 ENCOUNTER — Other Ambulatory Visit: Payer: Self-pay | Admitting: Family Medicine

## 2017-07-10 NOTE — Telephone Encounter (Signed)
Pt  LOV was 06/01/2017 and last refill was done on 09/09/2016 for #180 with 3 refills, pt is requesting for refills please Advice

## 2017-09-04 ENCOUNTER — Other Ambulatory Visit: Payer: Self-pay | Admitting: Family Medicine

## 2017-09-04 NOTE — Telephone Encounter (Signed)
Last OV 06/01/2017   Last refilled 02/11/2017 disp 90 with 1 refill   Sent to PCP for approval

## 2017-09-24 ENCOUNTER — Ambulatory Visit (INDEPENDENT_AMBULATORY_CARE_PROVIDER_SITE_OTHER)
Admission: RE | Admit: 2017-09-24 | Discharge: 2017-09-24 | Disposition: A | Discharge status: Home / Self Care | Payer: Commercial Managed Care - PPO | Source: Ambulatory Visit | Attending: Family Medicine | Admitting: Family Medicine

## 2017-09-24 ENCOUNTER — Ambulatory Visit: Payer: Self-pay

## 2017-09-24 ENCOUNTER — Ambulatory Visit: Payer: Commercial Managed Care - PPO | Admitting: Family Medicine

## 2017-09-24 ENCOUNTER — Encounter: Payer: Self-pay | Admitting: Family Medicine

## 2017-09-24 ENCOUNTER — Encounter: Payer: Self-pay | Admitting: *Deleted

## 2017-09-24 VITALS — BP 112/80 | HR 104 | Temp 97.9°F | Ht 66.0 in | Wt 189.8 lb

## 2017-09-24 DIAGNOSIS — S92512A Displaced fracture of proximal phalanx of left lesser toe(s), initial encounter for closed fracture: Secondary | ICD-10-CM

## 2017-09-24 DIAGNOSIS — T148XXA Other injury of unspecified body region, initial encounter: Secondary | ICD-10-CM

## 2017-09-24 DIAGNOSIS — S99922A Unspecified injury of left foot, initial encounter: Secondary | ICD-10-CM | POA: Diagnosis not present

## 2017-09-24 NOTE — Progress Notes (Signed)
Dr. Karleen Hampshire T. Renaldo Gornick, MD, CAQ Sports Medicine [facility] [address] [place], [address] Phone: [phone] Fax: [phone]  09/24/2017  Patient: Lacey Sullivan, MRN: [id number], DOB: Jan 21, 1971, 47 y.o.  Primary Physician:  Nelwyn Salisbury, MD   Chief Complaint  Patient presents with  . Foot Injury    Left-Hit toes on leg on chair today at pool   Subjective:   Lacey Sullivan is a 47 y.o. very pleasant female patient who presents with the following:  L foot. Forefoot injury.  Earlier today, the patient directly struck her forefoot on a chair while she was at the pool, and she has been having a significant amount of difficulty bearing weight on her left foot since then.  She has some swelling at the forefoot across the entirety of the forefoot as well as pain on the bottom of her foot.  At this point she is not having any bruising.  She is having pain from 2 through 5 phalanges as well as the metatarsal heads.  Past Medical History, Surgical History, Social History, Family History, Problem List, Medications, and Allergies have been reviewed and updated if relevant.  Patient Active Problem List   Diagnosis Date Noted  . Eczema 02/11/2017  . Skin abnormalities 01/24/2017  . Tobacco abuse 10/23/2015  . Rheumatoid arthritis (HCC) 06/18/2015  . Osteoarthritis 08/22/2013  . Carpal tunnel syndrome 08/22/2013  . Insomnia 08/20/2009  . VIRAL URI 04/12/2008  . LOW BACK PAIN 08/26/2007  . EPIGASTRIC PAIN 08/26/2007  . Depression 11/16/2006  . RESTLESS LEG SYNDROME 11/16/2006  . ALLERGIC RHINITIS 11/16/2006    Past Medical History:  Diagnosis Date  . Allergy   . Depression   . Gynecological examination    sees Dr. Duane Lope  . Insomnia   . Low back pain Dr. Noel Gerold  . Restless legs     Past Surgical History:  Procedure Laterality Date  . back surgeries  04/09/06 06/23/06   x2 level per Dr. Noel Gerold  . CHOLECYSTECTOMY  10/15/07   per Dr. Lindie Spruce  .  NECK SURGERY  11/06  . SHOULDER ARTHROSCOPY  4/07    Social History   Socioeconomic History  . Marital status: Married    Spouse name: Not on file  . Number of children: Not on file  . Years of education: Not on file  . Highest education level: Not on file  Occupational History  . Not on file  Social Needs  . Financial resource strain: Not on file  . Food insecurity:    Worry: Not on file    Inability: Not on file  . Transportation needs:    Medical: Not on file    Non-medical: Not on file  Tobacco Use  . Smoking status: Current Every Day Smoker    Packs/day: 1.00    Types: Cigarettes  . Smokeless tobacco: Never Used  Substance and Sexual Activity  . Alcohol use: Yes    Alcohol/week: 0.0 oz    Comment: occ  . Drug use: No  . Sexual activity: Not on file  Lifestyle  . Physical activity:    Days per week: Not on file    Minutes per session: Not on file  . Stress: Not on file  Relationships  . Social connections:    Talks on phone: Not on file    Gets together: Not on file    Attends religious service: Not on file    Active member of  club or organization: Not on file    Attends meetings of clubs or organizations: Not on file    Relationship status: Not on file  . Intimate partner violence:    Fear of current or ex partner: Not on file    Emotionally abused: Not on file    Physically abused: Not on file    Forced sexual activity: Not on file  Other Topics Concern  . Not on file  Social History Narrative  . Not on file    Family History  Problem Relation Age of Onset  . Coronary artery disease Other   . Cancer Other        lung, breast    Allergies  Allergen Reactions  . Inhaled Anticholinergic Agents     Like strep throat     Medication list reviewed and updated in full in [facility].  GEN: No fevers, chills. Nontoxic. Primarily MSK c/o today. MSK: Detailed in the HPI GI: tolerating PO intake without difficulty Neuro: No numbness,  parasthesias, or tingling associated. Otherwise the pertinent positives of the ROS are noted above.   Objective:   BP 112/80   Pulse (!) 104   Temp 97.9 F (36.6 C) (Oral)   Ht 5\' 6"  (1.676 m)   Wt 189 lb 12 oz (86.1 kg)   BMI 30.63 kg/m    GEN: WDWN, NAD, Non-toxic, Alert & Oriented x 3 HEENT: Atraumatic, Normocephalic.  Ears and Nose: No external deformity. EXTR: No clubbing/cyanosis/edema NEURO: antalgic gait PSYCH: Normally interactive. Conversant. Not depressed or anxious appearing.  Calm demeanor.    She is nontender at the navicular, cuboid, talus.  Nontender at the base of the fifth metatarsal.  Proximal metatarsals metatarsal heads are modestly tender to palpation from 2 through 5.  First second thir toes are grossly nontender.d the fourth is moderately tender to palpation in the fifth toe is significantly tender.  Patient is nontender on the medial and lateral malleolus.  Nontender at the ATFL, CFL, and deltoid ligaments.  There is moderate amount of swelling at the distal foot without any bruising.  Radiology: Dg Foot Complete Left  Result Date: 09/24/2017 CLINICAL DATA:  Left foot injury today. EXAM: LEFT FOOT - COMPLETE 3+ VIEW COMPARISON:  None. FINDINGS: Minimally displaced fracture is seen involving the fifth proximal phalanx. No other fracture or bony abnormality is noted. Joint spaces are intact. No soft tissue abnormality is noted IMPRESSION: Minimally displaced fifth proximal phalangeal fracture. Electronically Signed   By: Lupita Raider, M.D.   On: 09/24/2017 16:21     Assessment and Plan:   Closed displaced fracture of proximal phalanx of lesser toe of left foot, initial encounter  Foot injury, left, initial encounter - Plan: DG Foot Complete Left  Contusion of bone  Stable proximal fifth toe fracture with mild displacement.  No concerns, and this should heal uneventfully.  My suspicion is that the majority of her pain is coming from direct bone  contusion at the forefoot from trauma to the furniture.  Given her level of pain currently, I placed her in a short pneumatic Cam walker boot, and she is going to continue this for 3 to 4 weeks.  Typically I do not follow these with toe fractures up, but I told the patient she can follow-up with me at any point if she has any concerns or problems.  Orders Placed This Encounter  Procedures  . DG Foot Complete Left    Signed,  Soniyah Mcglory T. Yenesis Even,  MD   Allergies as of 09/24/2017      Reactions   Inhaled Anticholinergic Agents    Like strep throat       Medication List        Accurate as of 09/24/17 11:59 PM. Always use your most recent med list.          ALPRAZolam 1 MG tablet Commonly known as:  XANAX TAKE 1 TABLET BY MOUTH AT BEDTIME   baclofen 10 MG tablet Commonly known as:  LIORESAL TAKE 1 TABLET (10MG ) BY ORAL ROUTE 3 TIMES PER DAY AS NEEDED   buPROPion 300 MG 24 hr tablet Commonly known as:  WELLBUTRIN XL TAKE 1 TABLET BY MOUTH IN THE MORNING   calcium-vitamin D 500-200 MG-UNIT tablet Commonly known as:  OSCAL WITH D Take 1 tablet by mouth 2 (two) times daily.   CIMZIA [place] Inject 200 mg into the skin every 14 (fourteen) days.   DICLOFENAC PO Take 75 mg by mouth 2 (two) times daily.   folic acid 1 MG tablet Commonly known as:  FOLVITE Take 1 mg by mouth daily.   gabapentin 300 MG capsule Commonly known as:  NEURONTIN Take 300 mg by mouth 2 (two) times daily.   HYDROcodone-acetaminophen 10-325 MG tablet Commonly known as:  NORCO Take 0.5 tablets by mouth every 6 (six) hours as needed.   levonorgestrel 20 MCG/24HR IUD Commonly known as:  MIRENA 1 each by Intrauterine route once.   methotrexate 2.5 MG tablet Commonly known as:  RHEUMATREX Take 15 mg by mouth once a week. Caution:Chemotherapy. Protect from light.   phentermine 37.5 MG capsule TAKE 1 CAPSULE (37.5 MG TOTAL) BY MOUTH EVERY MORNING.   rOPINIRole 2 MG tablet Commonly known as:   REQUIP TAKE 2 TABLETS (4 MG TOTAL) BY MOUTH AT BEDTIME.   triamcinolone cream 0.1 % Commonly known as:  KENALOG Apply 1 application topically 3 (three) times daily.   VITAMIN B 12 PO Take by mouth daily.   VITAMIN D PO Take by mouth daily.

## 2017-09-24 NOTE — Telephone Encounter (Signed)
Pt. Hit her left foot outside at her pool on a chair. All toes, except the Great toe, hurt. Foot is swollen. Pain is a "10" when she tries to bear weight. No availability at [facility] and pt. Is close to [facility]. Appointment made at [facility] location for today. Reason for Disposition . [1] MODERATE pain (e.g., interferes with normal activities, limping) AND [2] present > 3 days  Answer Assessment - Initial Assessment Questions 1. ONSET: "When did the pain start?"      Today 2. LOCATION: "Where is the pain located?"      Left foot - all toes except bid toe 3. PAIN: "How bad is the pain?"    (Scale 1-10; or mild, moderate, severe)   -  MILD (1-3): doesn't interfere with normal activities    -  MODERATE (4-7): interferes with normal activities (e.g., work or school) or awakens from sleep, limping    -  SEVERE (8-10): excruciating pain, unable to do any normal activities, unable to walk     10 when trying to walk 4. WORK OR EXERCISE: "Has there been any recent work or exercise that involved this part of the body?"      No 5. CAUSE: "What do you think is causing the foot pain?"     Maybe a fracture 6. OTHER SYMPTOMS: "Do you have any other symptoms?" (e.g., leg pain, rash, fever, numbness)     Swelling 7. PREGNANCY: "Is there any chance you are pregnant?" "When was your last menstrual period?"     No  Protocols used: FOOT PAIN-A-AH

## 2017-11-09 ENCOUNTER — Encounter: Payer: Self-pay | Admitting: Family Medicine

## 2017-11-09 ENCOUNTER — Ambulatory Visit: Payer: Commercial Managed Care - PPO | Admitting: Family Medicine

## 2017-11-09 VITALS — BP 140/100 | HR 87 | Temp 98.0°F | Wt 197.0 lb

## 2017-11-09 DIAGNOSIS — F418 Other specified anxiety disorders: Secondary | ICD-10-CM

## 2017-11-09 DIAGNOSIS — F5101 Primary insomnia: Secondary | ICD-10-CM

## 2017-11-09 DIAGNOSIS — R5383 Other fatigue: Secondary | ICD-10-CM | POA: Diagnosis not present

## 2017-11-09 LAB — BASIC METABOLIC PANEL
BUN: 21 mg/dL (ref 6–23)
CALCIUM: 9 mg/dL (ref 8.4–10.5)
CO2: 30 meq/L (ref 19–32)
CREATININE: 0.97 mg/dL (ref 0.40–1.20)
Chloride: 104 mEq/L (ref 96–112)
GFR: 65.5 mL/min (ref >60.00)
GLUCOSE: 69 mg/dL — AB (ref 70–99)
Potassium: 5.1 mEq/L (ref 3.5–5.1)
SODIUM: 137 meq/L (ref 135–145)

## 2017-11-09 LAB — T3, FREE: T3, Free: 3.3 pg/mL (ref 2.3–4.2)

## 2017-11-09 LAB — CBC WITH DIFFERENTIAL/PLATELET
BASOS ABS: 0.1 10*3/uL (ref 0.0–0.1)
Basophils Relative: 0.9 % (ref 0.0–3.0)
EOS ABS: 0.5 10*3/uL (ref 0.0–0.7)
Eosinophils Relative: 4.6 % (ref 0.0–5.0)
HCT: 38.3 % (ref 36.0–46.0)
HEMOGLOBIN: 13 g/dL (ref 12.0–15.0)
LYMPHS ABS: 3.5 10*3/uL (ref 0.7–4.0)
Lymphocytes Relative: 33.6 % (ref 12.0–46.0)
MCHC: 33.9 g/dL (ref 30.0–36.0)
MCV: 89.9 fl (ref 78.0–100.0)
MONO ABS: 0.5 10*3/uL (ref 0.1–1.0)
MONOS PCT: 4.6 % (ref 3.0–12.0)
Neutro Abs: 5.8 10*3/uL (ref 1.4–7.7)
Neutrophils Relative %: 56.3 % (ref 43.0–77.0)
PLATELETS: 241 10*3/uL (ref 150.0–400.0)
RBC: 4.26 Mil/uL (ref 3.87–5.11)
RDW: 14.6 % (ref 11.5–15.5)
WBC: 10.4 10*3/uL (ref 4.0–10.5)

## 2017-11-09 LAB — T4, FREE: FREE T4: 0.94 ng/dL (ref 0.60–1.60)

## 2017-11-09 LAB — TSH: TSH: 1.65 u[IU]/mL (ref 0.35–4.50)

## 2017-11-09 LAB — VITAMIN B12: Vitamin B-12: 839 pg/mL (ref 211–911)

## 2017-11-09 MED ORDER — ALPRAZOLAM 1 MG PO TABS: 1.0000 mg | ORAL_TABLET | Freq: Two times a day (BID) | ORAL | 5 refills | Status: DC | PRN

## 2017-11-09 MED ORDER — BUPROPION HCL ER (XL) 300 MG PO TB24: 300.0000 mg | ORAL_TABLET | Freq: Every morning | ORAL | 5 refills | Status: DC

## 2017-11-09 NOTE — Progress Notes (Signed)
   Subjective:    Patient ID: Lacey Sullivan, female    DOB: 18-Dec-1970, 47 y.o.   MRN: [id number]  HPI Here to discuss some generalized fatigue she has noticed for several weeks. She thinks it may be related to stress. She also does not sleep well. She takes 1/2 tablet (0.5 mg) of Xanax at bedtime but she has trouble relaxing and falling asleep. She is often anxious during the day but she never uses any Xanax during the day. She is still pleased with Wellbutrin and she denies feeling depressed.    Review of Systems  Constitutional: Positive for fatigue.  Respiratory: Negative.   Cardiovascular: Negative.   Neurological: Negative.   Psychiatric/Behavioral: Positive for sleep disturbance. Negative for dysphoric mood. The patient is nervous/anxious.        Objective:   Physical Exam  Constitutional: She is oriented to person, place, and time. She appears well-developed and well-nourished.  Neck: No thyromegaly present.  Cardiovascular: Normal rate, regular rhythm, normal heart sounds and intact distal pulses.  Pulmonary/Chest: Effort normal and breath sounds normal.  Lymphadenopathy:    She has no cervical adenopathy.  Neurological: She is alert and oriented to person, place, and time.  Psychiatric: She has a normal mood and affect. Her behavior is normal. Thought content normal.          Assessment & Plan:  Fatigue, probably the result of some anxiety and sleep deprivation. She will increase the Xanax to taking a full 1 mg tablet at bedtime, and she can take some Xanax during the day as needed. Screen with labs today including a B12 level.  Gershon Crane, MD

## 2017-11-17 ENCOUNTER — Encounter: Payer: Self-pay | Admitting: *Deleted

## 2018-04-04 ENCOUNTER — Other Ambulatory Visit: Payer: Self-pay | Admitting: Family Medicine

## 2018-04-07 NOTE — Telephone Encounter (Signed)
Last rx given on 7/12 #90 with 1 ref

## 2018-04-21 ENCOUNTER — Encounter: Payer: Self-pay | Admitting: Family Medicine

## 2018-04-21 ENCOUNTER — Ambulatory Visit: Payer: Commercial Managed Care - PPO | Admitting: Family Medicine

## 2018-04-21 VITALS — BP 118/78 | HR 90 | Temp 99.1°F | Ht 66.0 in | Wt 212.0 lb

## 2018-04-21 DIAGNOSIS — E631 Imbalance of constituents of food intake: Secondary | ICD-10-CM

## 2018-04-21 DIAGNOSIS — E0590 Thyrotoxicosis, unspecified without thyrotoxic crisis or storm: Secondary | ICD-10-CM

## 2018-04-21 LAB — HEPATIC FUNCTION PANEL
ALBUMIN: 4.3 g/dL (ref 3.5–5.2)
ALK PHOS: 64 U/L (ref 39–117)
ALT: 19 U/L (ref 0–35)
AST: 20 U/L (ref 0–37)
Bilirubin, Direct: 0.1 mg/dL (ref 0.0–0.3)
TOTAL PROTEIN: 7.1 g/dL (ref 6.0–8.3)
Total Bilirubin: 0.4 mg/dL (ref 0.2–1.2)

## 2018-04-21 LAB — CBC WITH DIFFERENTIAL/PLATELET
BASOS ABS: 0.1 10*3/uL (ref 0.0–0.1)
Basophils Relative: 0.6 % (ref 0.0–3.0)
EOS ABS: 0.4 10*3/uL (ref 0.0–0.7)
Eosinophils Relative: 4.2 % (ref 0.0–5.0)
HCT: 38.4 % (ref 36.0–46.0)
Hemoglobin: 12.9 g/dL (ref 12.0–15.0)
Lymphocytes Relative: 41.2 % (ref 12.0–46.0)
Lymphs Abs: 3.6 10*3/uL (ref 0.7–4.0)
MCHC: 33.5 g/dL (ref 30.0–36.0)
MCV: 90.4 fl (ref 78.0–100.0)
Monocytes Absolute: 0.5 10*3/uL (ref 0.1–1.0)
Monocytes Relative: 5.9 % (ref 3.0–12.0)
Neutro Abs: 4.2 10*3/uL (ref 1.4–7.7)
Neutrophils Relative %: 48.1 % (ref 43.0–77.0)
Platelets: 264 10*3/uL (ref 150.0–400.0)
RBC: 4.25 Mil/uL (ref 3.87–5.11)
RDW: 16.3 % — ABNORMAL HIGH (ref 11.5–15.5)
WBC: 8.7 10*3/uL (ref 4.0–10.5)

## 2018-04-21 LAB — BASIC METABOLIC PANEL
BUN: 15 mg/dL (ref 6–23)
CALCIUM: 9.3 mg/dL (ref 8.4–10.5)
CO2: 27 meq/L (ref 19–32)
Chloride: 104 mEq/L (ref 96–112)
Creatinine, Ser: 1 mg/dL (ref 0.40–1.20)
GFR: 59.38 mL/min — AB (ref >60.00)
GLUCOSE: 66 mg/dL — AB (ref 70–99)
Potassium: 4.5 mEq/L (ref 3.5–5.1)
Sodium: 139 mEq/L (ref 135–145)

## 2018-04-21 NOTE — Progress Notes (Signed)
   Subjective:    Patient ID: Lacey Sullivan, female    DOB: [birth year], 48 y.o.   MRN: [id number]  HPI Here with concern over recent intense cravings for chocolate. She has been dieting and was able to lose 48 lbs over the past year, but lately she has gained back about 20 of them. Now she has this craving for chocolate and she relates one night recently where she sat down and ate 3 candy bars. No recent change in medications. She has had an IUD in place for a year. She took an at home pregancy test which was negative.    Review of Systems  Constitutional: Positive for appetite change and unexpected weight change.  Respiratory: Negative.   Cardiovascular: Negative.   Gastrointestinal: Negative.   Neurological: Negative.        Objective:   Physical Exam Constitutional:      Appearance: Normal appearance.  Cardiovascular:     Rate and Rhythm: Normal rate and regular rhythm.     Pulses: Normal pulses.     Heart sounds: Normal heart sounds.  Pulmonary:     Effort: Pulmonary effort is normal.     Breath sounds: Normal breath sounds.  Neurological:     General: No focal deficit present.     Mental Status: She is alert. Mental status is at baseline.           Assessment & Plan:  Food cravings. We will screen with some labs today. I encouraged her to get rid of all snack foods from her house and to exercise more.  Gershon Crane, MD

## 2018-04-22 LAB — VITAMIN D 25 HYDROXY (VIT D DEFICIENCY, FRACTURES): VITD: 60.83 ng/mL (ref 30.00–100.00)

## 2018-04-22 LAB — T3, FREE: T3 FREE: 9.8 pg/mL — AB (ref 2.3–4.2)

## 2018-04-22 LAB — TSH: TSH: 1.77 u[IU]/mL (ref 0.35–4.50)

## 2018-04-22 LAB — T4, FREE: FREE T4: 1.98 ng/dL — AB (ref 0.60–1.60)

## 2018-04-22 LAB — VITAMIN B12: Vitamin B-12: 765 pg/mL (ref 211–911)

## 2018-04-26 ENCOUNTER — Encounter: Payer: Self-pay | Admitting: *Deleted

## 2018-04-26 NOTE — Addendum Note (Signed)
Addended by: Gershon Crane A on: 04/26/2018 12:47 PM   Modules accepted: Orders

## 2018-05-10 ENCOUNTER — Encounter: Payer: Self-pay | Admitting: Internal Medicine

## 2018-05-10 ENCOUNTER — Ambulatory Visit: Payer: Commercial Managed Care - PPO | Admitting: Internal Medicine

## 2018-05-10 ENCOUNTER — Other Ambulatory Visit: Payer: Self-pay

## 2018-05-10 VITALS — BP 138/78 | HR 99 | Ht 66.0 in | Wt 209.4 lb

## 2018-05-10 DIAGNOSIS — R946 Abnormal results of thyroid function studies: Secondary | ICD-10-CM | POA: Diagnosis not present

## 2018-05-10 LAB — TSH: TSH: 1.52 u[IU]/mL (ref 0.35–4.50)

## 2018-05-10 LAB — T4, FREE: Free T4: 1.01 ng/dL (ref 0.60–1.60)

## 2018-05-10 NOTE — Progress Notes (Signed)
Name: Lacey FiscalHeather M Sirico  MRN/ DOB: [id number], 04-24-1970    Age/ Sex: 48 y.o., female    PCP: Nelwyn SalisburyFry, Stephen A, MD   Reason for Endocrinology Evaluation: Hyperthyroidism     Date of Initial Endocrinology Evaluation: 05/10/2018     HPI: [name] is a 48 y.o. female with unremarkable past medical history. The patient presented for initial endocrinology clinic visit on 05/10/2018 for consultative assistance with her hyperthyroidism  Pt presented to her PCP with c/o cravings and weight gain, gained 10 Lbs in 3 weeks  in February, 2020, she was noted to have normal TSH with high free T3 and T4 .  The patient is on Biotin - has not taken biotin in over a week.    Today she is c/o fatigue for the past week. Denies fever or congestion  Denies diarrhea Has chronic anxiety and jittery sensation.   Has hot flashes   Denies local neck symptoms.  Has an IUD.   Maternal Cousin with hyperthyroidism.    HISTORY:  Past Medical History:  Past Medical History:  Diagnosis Date  . Allergy   . Arthritis    rheumatoid, sees Dr. Alben DeedsJames Beekman   . Depression   . Gynecological examination    sees Dr. Duane LopeAlan Ross  . Insomnia   . Low back pain Dr. Noel Geroldohen  . Restless legs    Past Surgical History:  Past Surgical History:  Procedure Laterality Date  . back surgeries  04/09/06 06/23/06   x2 level per Dr. Noel Geroldohen  . CHOLECYSTECTOMY  10/15/07   per Dr. Lindie SpruceWyatt  . NECK SURGERY  11/06  . SHOULDER ARTHROSCOPY  4/07      Social History:  reports that she has been smoking cigarettes. She has been smoking about 1.00 pack per day. She has never used smokeless tobacco. She reports current alcohol use. She reports that she does not use drugs.  Family History: family history includes Cancer in an other family member; Coronary artery disease in an other family member.   HOME MEDICATIONS: Allergies as of 05/10/2018      Reactions   Inhaled Anticholinergic Agents    Like strep throat        Medication List       Accurate as of May 10, 2018  9:41 AM. Always use your most recent med list.        ALPRAZolam 1 MG tablet Commonly known as:  XANAX Take 1 tablet (1 mg total) by mouth 2 (two) times daily as needed for anxiety or sleep.   baclofen 10 MG tablet Commonly known as:  LIORESAL TAKE 1 TABLET (10MG ) BY ORAL ROUTE 3 TIMES PER DAY AS NEEDED   Biotin 1000 MCG tablet Take 1,000 mcg by mouth daily.   buPROPion 300 MG 24 hr tablet Commonly known as:  WELLBUTRIN XL Take 1 tablet (300 mg total) by mouth every morning.   calcium-vitamin D 500-200 MG-UNIT tablet Commonly known as:  OSCAL WITH D Take 1 tablet by mouth 2 (two) times daily.   CIMZIA [place] Inject 200 mg into the skin every 14 (fourteen) days.   COLLAGEN PO Take by mouth.   DICLOFENAC PO Take 75 mg by mouth 2 (two) times daily.   folic acid 1 MG tablet Commonly known as:  FOLVITE Take 1 mg by mouth daily.   gabapentin 300 MG capsule Commonly known as:  NEURONTIN Take 300 mg by mouth 2 (two) times daily.   HYDROcodone-acetaminophen 10-325 MG  tablet Commonly known as:  NORCO Take 0.5 tablets by mouth every 6 (six) hours as needed.   levonorgestrel 20 MCG/24HR IUD Commonly known as:  MIRENA 1 each by Intrauterine route once.   methotrexate 2.5 MG tablet Commonly known as:  RHEUMATREX Take 15 mg by mouth once a week. Caution:Chemotherapy. Protect from light.   phentermine 37.5 MG capsule TAKE 1 CAPSULE (37.5 MG TOTAL) BY MOUTH EVERY MORNING.   rOPINIRole 2 MG tablet Commonly known as:  REQUIP TAKE 2 TABLETS (4 MG TOTAL) BY MOUTH AT BEDTIME.   triamcinolone cream 0.1 % Commonly known as:  KENALOG Apply 1 application topically 3 (three) times daily.   VITAMIN B 12 PO Take by mouth daily.   VITAMIN D PO Take by mouth daily.         REVIEW OF SYSTEMS: A comprehensive ROS was conducted with the patient and is negative except as per HPI and below:  ROS     OBJECTIVE:  VS: BP  138/78 (BP Location: Left Arm, Patient Position: Sitting, Cuff Size: Normal)   Pulse 99   Ht 5\' 6"  (1.676 m)   Wt 209 lb 6.4 oz (95 kg)   SpO2 98%   BMI 33.80 kg/m    Wt Readings from Last 3 Encounters:  05/10/18 209 lb 6.4 oz (95 kg)  04/21/18 212 lb (96.2 kg)  11/09/17 197 lb (89.4 kg)     EXAM: General: Pt appears well and is in NAD  Hydration: Well-hydrated with moist mucous membranes and good skin turgor  Eyes: External eye exam normal without stare, lid lag or exophthalmos.  EOM intact.  PERRL.  Ears, Nose, Throat: Hearing: Grossly intact bilaterally Dental: Good dentition  Throat: Clear without mass, erythema or exudate  Neck: General: Supple without adenopathy. Thyroid: Thyroid size normal.  No goiter or nodules appreciated. No thyroid bruit.  Lungs: Clear with good BS bilat with no rales, rhonchi, or wheezes  Heart: Auscultation: RRR.  Abdomen: Normoactive bowel sounds, soft, nontender, without masses or organomegaly palpable  Extremities: Gait and station: Normal gait  Digits and nails: No clubbing, cyanosis, petechiae, or nodes Head and neck: Normal alignment and mobility BL UE: Normal ROM and strength. BL LE: No pretibial edema normal ROM and strength.  Skin: Hair: Texture and amount normal with gender appropriate distribution Skin Inspection: No rashes, acanthosis nigricans/skin tags. No lipohypertrophy Skin Palpation: Skin temperature, texture, and thickness normal to palpation  Neuro: Cranial nerves: II - XII grossly intact  Cerebellar: Normal coordination and movement; no tremor Motor: Normal strength throughout DTRs: 2+ and symmetric in UE without delay in relaxation phase  Mental Status: Judgment, insight: Intact Orientation: Oriented to time, place, and person Memory: Intact for recent and remote events Mood and affect: No depression, anxiety, or agitation     DATA REVIEWED: Results for JEIDY, LEVITAN (MRN [id number]) as of 05/10/2018 14:15  Ref.  Range 04/21/2018 09:39 05/10/2018 09:52  TSH Latest Ref Range: 0.35 - 4.50 uIU/mL 1.77 1.52  Triiodothyronine,Free,Serum Latest Ref Range: 2.3 - 4.2 pg/mL 9.8 (H)   T4,Free(Direct) Latest Ref Range: 0.60 - 1.60 ng/dL 1.82 (H) 9.93     Old records , labs and images have been reviewed.    ASSESSMENT/PLAN/RECOMMENDATIONS:   1. Abnormal Thyroid function test :  -She is clinically euthyroid -She denies any local neck symptoms -Her TFTs are discordant with her symptoms, patient was on biotin at the time which could interfere with thyroid function test results. -Repeat  TFTs today are  normal, including TSH and FT4, she has been off Biotin for a week. T3- pending  Follow-up PRN  Signed electronically by: Lyndle Herrlich, MD  [facility]  [facility] [address], [address] [place], [place] [address] Phone: [phone] FAX: [phone]   CC: Nelwyn Salisbury, MD [address] [place] [address] Phone: [phone] Fax: [phone]   Return to Endocrinology clinic as below: No future appointments.

## 2018-05-10 NOTE — Patient Instructions (Signed)
-   Please stop by the lab today to redraw your thyroid function tests.  - We will contact you with any results  - Please remember to hold Biotin for at least 48-72 hours prior to any thyroid checks.

## 2018-05-11 LAB — T3: T3, Total: 88 ng/dL (ref 76–181)

## 2018-07-27 ENCOUNTER — Other Ambulatory Visit: Payer: Self-pay | Admitting: Family Medicine

## 2018-07-29 NOTE — Telephone Encounter (Signed)
Call in #[phone] with 5 rf 

## 2018-07-29 NOTE — Telephone Encounter (Signed)
Dr. Clent Ridges please advise of the refill

## 2018-08-03 ENCOUNTER — Other Ambulatory Visit: Payer: Self-pay | Admitting: Family Medicine

## 2018-08-04 ENCOUNTER — Other Ambulatory Visit: Payer: Self-pay | Admitting: Family Medicine

## 2018-08-04 NOTE — Telephone Encounter (Signed)
Refill called to the pharmacy and left on the VM 

## 2018-08-04 NOTE — Telephone Encounter (Signed)
Dr. Fry please advise. Thanks  

## 2018-08-11 ENCOUNTER — Ambulatory Visit: Payer: Self-pay

## 2018-08-11 NOTE — Telephone Encounter (Signed)
Pt. Called to report indirect exposure to COVID.  Reported she joined her family at [employer]; on her way home at time of call.  Reported she found out after being at beach, that someone her family members had been exposed to, 9 days prior, tested positive for COVID.  Reported the family members did not have any symptoms of illness.  Pt. denied any symptoms of illness.  Voiced concern of having auto-immune disorder, and being at high risk.   Reviewed care advice with the pt. Per protocol.  Verb. Understanding.  Advised will send triage note to Provider to review for further recommendations.  Pt. Agrees with plan.    Reason for Disposition . [1] COVID-19 EXPOSURE (Close Contact) AND [2] within last 14 days BUT [3] NO symptoms  Answer Assessment - Initial Assessment Questions 1. CLOSE CONTACT: "Who is the person with the confirmed or suspected COVID-19 infection that you were exposed to?"     Pt's family exposed 9 days ago to friend 2. PLACE of CONTACT: "Where were you when you were exposed to COVID-19?" (e.g., home, school, medical waiting room; which city?)    Pt. Was exposed to family members that were exposed 3. TYPE of CONTACT: "How much contact was there?" (e.g., sitting next to, live in same house, work in same office, same building)     indirect 4. DURATION of CONTACT: "How long were you in contact with the COVID-19 patient?" (e.g., a few seconds, passed by person, a few minutes, live with the patient)    In same room, shared meals  5. DATE of CONTACT: "When did you have contact with a COVID-19 patient?" (e.g., how many days ago)     6/14-6/17 6. TRAVEL: "Have you traveled out of the country recently?" If so, "When and where?"     * Also ask about out-of-state travel, since the CDC has identified some high-risk cities for community spread in the [place].     * Note: Travel becomes less relevant if there is widespread community transmission where the patient lives.    [facility] 7. COMMUNITY SPREAD: "Are there lots of cases of COVID-19 (community spread) where you live?" (See public health department website, if unsure)      Present in community 8. SYMPTOMS: "Do you have any symptoms?" (e.g., fever, cough, breathing difficulty)    Denied any symptoms; denied that family who was exposed had any symptoms 9. PREGNANCY OR POSTPARTUM: "Is there any chance you are pregnant?" "When was your last menstrual period?" "Did you deliver in the last 2 weeks?"     LMP; has an IUD ; last Menstrual cycle one yr.  10. HIGH RISK: "Do you have any heart or lung problems? Do you have a weak immune system?" (e.g., CHF, COPD, asthma, HIV positive, chemotherapy, renal failure, diabetes mellitus, sickle cell anemia)      Rheumatoid Arthritis; denied any other chronic illness  Protocols used: CORONAVIRUS (COVID-19) EXPOSURE-A-AH

## 2018-08-11 NOTE — Telephone Encounter (Signed)
Clinic RN scheduled patient a virtual visit with PCP for 08/12/2018 at 8:45 AM.

## 2018-08-11 NOTE — Telephone Encounter (Signed)
Noted  

## 2018-08-12 ENCOUNTER — Other Ambulatory Visit: Payer: Self-pay

## 2018-08-12 ENCOUNTER — Encounter: Payer: Self-pay | Admitting: Family Medicine

## 2018-08-12 ENCOUNTER — Ambulatory Visit (INDEPENDENT_AMBULATORY_CARE_PROVIDER_SITE_OTHER): Payer: Commercial Managed Care - PPO | Admitting: Family Medicine

## 2018-08-12 DIAGNOSIS — Z20828 Contact with and (suspected) exposure to other viral communicable diseases: Secondary | ICD-10-CM

## 2018-08-12 DIAGNOSIS — Z209 Contact with and (suspected) exposure to unspecified communicable disease: Secondary | ICD-10-CM

## 2018-08-12 NOTE — Progress Notes (Signed)
Subjective:    Patient ID: Lacey Sullivan, female    DOB: 28-Feb-1970, 48 y.o.   MRN: [id number]  HPI Virtual Visit via Video Note  I connected with the patient on 08/12/18 at  8:45 AM EDT by a video enabled telemedicine application and verified that I am speaking with the correct person using two identifiers.  Location patient: home Location provider:work or home office Persons participating in the virtual visit: patient, provider  I discussed the limitations of evaluation and management by telemedicine and the availability of in person appointments. The patient expressed understanding and agreed to proceed.   HPI: Here to ask about a possible second hand exposure to the Covid-19. 5 days ago she spent the day with her family at the beach, and everyone was outside that day either on the beach or at the pool. Since coming back home Lacey Sullivan has felt fine. However she learned that someone who her family had been around the week prior had tested positive for the virus. That exposure was also outdoors only for one day. It has now been 12 days since the family was exposed an no one on the family has had any symptoms.    ROS: See pertinent positives and negatives per HPI.  Past Medical History:  Diagnosis Date  . Allergy   . Arthritis    rheumatoid, sees Dr. Alben Deeds   . Depression   . Gynecological examination    sees Dr. Duane Lope  . Insomnia   . Low back pain Dr. Noel Gerold  . Restless legs     Past Surgical History:  Procedure Laterality Date  . back surgeries  04/09/06 06/23/06   x2 level per Dr. Noel Gerold  . CHOLECYSTECTOMY  10/15/07   per Dr. Lindie Spruce  . NECK SURGERY  11/06  . SHOULDER ARTHROSCOPY  4/07    Family History  Problem Relation Age of Onset  . Coronary artery disease Other   . Cancer Other        lung, breast     Current Outpatient Medications:  .  ALPRAZolam (XANAX) 1 MG tablet, TAKE 1 TABLET (1 MG TOTAL) BY MOUTH 2 (TWO) TIMES DAILY AS NEEDED FOR ANXIETY OR  SLEEP., Disp: 60 tablet, Rfl: 5 .  baclofen (LIORESAL) 10 MG tablet, TAKE 1 TABLET (10MG ) BY ORAL ROUTE 3 TIMES PER DAY AS NEEDED, Disp: , Rfl: 2 .  Biotin 1000 MCG tablet, Take 1,000 mcg by mouth daily., Disp: , Rfl:  .  buPROPion (WELLBUTRIN XL) 300 MG 24 hr tablet, Take 1 tablet (300 mg total) by mouth every morning., Disp: 30 tablet, Rfl: 5 .  calcium-vitamin D (OSCAL WITH D) 500-200 MG-UNIT per tablet, Take 1 tablet by mouth 2 (two) times daily.  , Disp: , Rfl:  .  Certolizumab Pegol (CIMZIA [place]), Inject 200 mg into the skin every 14 (fourteen) days., Disp: , Rfl:  .  Cholecalciferol (VITAMIN D PO), Take by mouth daily., Disp: , Rfl:  .  COLLAGEN PO, Take by mouth., Disp: , Rfl:  .  Cyanocobalamin (VITAMIN B 12 PO), Take by mouth daily., Disp: , Rfl:  .  DICLOFENAC PO, Take 75 mg by mouth 2 (two) times daily., Disp: , Rfl:  .  folic acid (FOLVITE) 1 MG tablet, Take 1 mg by mouth daily., Disp: , Rfl:  .  gabapentin (NEURONTIN) 300 MG capsule, Take 300 mg by mouth 2 (two) times daily.  , Disp: , Rfl:  .  HYDROcodone-acetaminophen (NORCO) 10-325 MG per  tablet, Take 0.5 tablets by mouth every 6 (six) hours as needed. , Disp: , Rfl:  .  levonorgestrel (MIRENA) 20 MCG/24HR IUD, 1 each by Intrauterine route once., Disp: , Rfl:  .  methotrexate (RHEUMATREX) 2.5 MG tablet, Take 15 mg by mouth once a week. Caution:Chemotherapy. Protect from light., Disp: , Rfl:  .  phentermine 37.5 MG capsule, TAKE 1 CAPSULE (37.5 MG TOTAL) BY MOUTH EVERY MORNING., Disp: 90 capsule, Rfl: 1 .  rOPINIRole (REQUIP) 2 MG tablet, TAKE 2 TABLETS (4 MG TOTAL) BY MOUTH AT BEDTIME., Disp: 60 tablet, Rfl: 5 .  triamcinolone cream (KENALOG) 0.1 %, Apply 1 application topically 3 (three) times daily., Disp: 45 g, Rfl: 5  EXAM:  VITALS per patient if applicable:  GENERAL: alert, oriented, appears well and in no acute distress  HEENT: atraumatic, conjunttiva clear, no obvious abnormalities on inspection of external nose and  ears  NECK: normal movements of the head and neck  LUNGS: on inspection no signs of respiratory distress, breathing rate appears normal, no obvious gross SOB, gasping or wheezing  CV: no obvious cyanosis  MS: moves all visible extremities without noticeable abnormality  PSYCH/NEURO: pleasant and cooperative, no obvious depression or anxiety, speech and thought processing grossly intact  ASSESSMENT AND PLAN: Possible exposure to Covid-19. I reassured her that she has very little risk of contracting the illness given this scenario. She will follow up as needed. Alysia Penna, MD  Discussed the following assessment and plan:  No diagnosis found.     I discussed the assessment and treatment plan with the patient. The patient was provided an opportunity to ask questions and all were answered. The patient agreed with the plan and demonstrated an understanding of the instructions.   The patient was advised to call back or seek an in-person evaluation if the symptoms worsen or if the condition fails to improve as anticipated.     Review of Systems     Objective:   Physical Exam        Assessment & Plan:

## 2018-09-03 ENCOUNTER — Ambulatory Visit (INDEPENDENT_AMBULATORY_CARE_PROVIDER_SITE_OTHER): Payer: Commercial Managed Care - PPO | Admitting: Family Medicine

## 2018-09-03 ENCOUNTER — Encounter: Payer: Self-pay | Admitting: Family Medicine

## 2018-09-03 ENCOUNTER — Telehealth: Payer: Self-pay | Admitting: *Deleted

## 2018-09-03 ENCOUNTER — Ambulatory Visit: Payer: Self-pay | Admitting: *Deleted

## 2018-09-03 ENCOUNTER — Other Ambulatory Visit: Payer: Self-pay

## 2018-09-03 DIAGNOSIS — Z20822 Contact with and (suspected) exposure to covid-19: Secondary | ICD-10-CM

## 2018-09-03 DIAGNOSIS — R509 Fever, unspecified: Secondary | ICD-10-CM

## 2018-09-03 DIAGNOSIS — R0981 Nasal congestion: Secondary | ICD-10-CM

## 2018-09-03 DIAGNOSIS — R51 Headache: Secondary | ICD-10-CM | POA: Diagnosis not present

## 2018-09-03 DIAGNOSIS — Z03818 Encounter for observation for suspected exposure to other biological agents ruled out: Secondary | ICD-10-CM

## 2018-09-03 DIAGNOSIS — R6889 Other general symptoms and signs: Secondary | ICD-10-CM

## 2018-09-03 MED ORDER — AZITHROMYCIN 250 MG PO TABS: ORAL_TABLET | ORAL | 0 refills | Status: DC

## 2018-09-03 NOTE — Telephone Encounter (Signed)
  Patient is calling with possible COVID symptoms- call to office for appointment. Reason for Disposition . [1] COVID-19 infection suspected by caller or triager AND [2] mild symptoms (cough, fever, or others) AND [9] no complications or SOB  Answer Assessment - Initial Assessment Questions 1. COVID-19 DIAGNOSIS: "Who made your Coronavirus (COVID-19) diagnosis?" "Was it confirmed by a positive lab test?" If not diagnosed by a HCP, ask "Are there lots of cases (community spread) where you live?" (See public health department website, if unsure)     Patient is symptomatic 2. ONSET: "When did the COVID-19 symptoms start?"      yesterday 3. WORST SYMPTOM: "What is your worst symptom?" (e.g., cough, fever, shortness of breath, muscle aches)     Fever- body aches 4. COUGH: "Do you have a cough?" If so, ask: "How bad is the cough?"       No cough 5. FEVER: "Do you have a fever?" If so, ask: "What is your temperature, how was it measured, and when did it start?"     Yes- 99.5- 1:34- it has been as high as 100.7, digit- oral 6. RESPIRATORY STATUS: "Describe your breathing?" (e.g., shortness of breath, wheezing, unable to speak)      No problems with breathing 7. BETTER-SAME-WORSE: "Are you getting better, staying the same or getting worse compared to yesterday?"  If getting worse, ask, "In what way?"     Same 8. HIGH RISK DISEASE: "Do you have any chronic medical problems?" (e.g., asthma, heart or lung disease, weak immune system, etc.)     RA, smoker 9. PREGNANCY: "Is there any chance you are pregnant?" "When was your last menstrual period?"     IUD 10. OTHER SYMPTOMS: "Do you have any other symptoms?"  (e.g., chills, fatigue, headache, loss of smell or taste, muscle pain, sore throat)       Headache,congestion,fatigue  Protocols used: CORONAVIRUS (COVID-19) DIAGNOSED OR SUSPECTED-A-AH

## 2018-09-03 NOTE — Telephone Encounter (Signed)
-----   Message from Elie Confer, [place] sent at 09/03/2018  2:51 PM EDT ----- Good afternoon!!!  Dr. Sarajane Jews would like to have this patient tested for covid 19.  She started yesterday with fever of 100.7, headache, body aches, nasal congestion  no known exposures.  Thank you

## 2018-09-03 NOTE — Telephone Encounter (Signed)
Call to patient- COVID testing scheduled per PCP order.

## 2018-09-03 NOTE — Telephone Encounter (Signed)
Patient had an appointment with Dr. Sarajane Jews at 215 PM.

## 2018-09-03 NOTE — Progress Notes (Signed)
Subjective:    Patient ID: Lacey Sullivan, female    DOB: 09/08/1970, 48 y.o.   MRN: [id number]  HPI Virtual Visit via Video Note  I connected with the patient on 09/03/18 at  2:15 PM EDT by a video enabled telemedicine application and verified that I am speaking with the correct person using two identifiers.  Location patient: home Location provider:work or home office Persons participating in the virtual visit: patient, provider  I discussed the limitations of evaluation and management by telemedicine and the availability of in person appointments. The patient expressed understanding and agreed to proceed.   HPI: Here for 2 days of head congestion, headache, fever to 100.7 degrees, and body aches. No cough or chest pain or SOB. No NVD. Drinking fluids and taking Ibuprofen.    ROS: See pertinent positives and negatives per HPI.  Past Medical History:  Diagnosis Date  . Allergy   . Arthritis    rheumatoid, sees Dr. Leigh Aurora   . Depression   . Gynecological examination    sees Dr. Melinda Crutch  . Insomnia   . Low back pain Dr. Patrice Paradise  . Restless legs     Past Surgical History:  Procedure Laterality Date  . back surgeries  04/09/06 06/23/06   x2 level per Dr. Patrice Paradise  . CHOLECYSTECTOMY  10/15/07   per Dr. Hulen Skains  . NECK SURGERY  11/06  . SHOULDER ARTHROSCOPY  4/07    Family History  Problem Relation Age of Onset  . Coronary artery disease Other   . Cancer Other        lung, breast     Current Outpatient Medications:  .  ALPRAZolam (XANAX) 1 MG tablet, TAKE 1 TABLET (1 MG TOTAL) BY MOUTH 2 (TWO) TIMES DAILY AS NEEDED FOR ANXIETY OR SLEEP., Disp: 60 tablet, Rfl: 5 .  azithromycin (ZITHROMAX Z-PAK) 250 MG tablet, As directed, Disp: 6 each, Rfl: 0 .  baclofen (LIORESAL) 10 MG tablet, TAKE 1 TABLET (10MG ) BY ORAL ROUTE 3 TIMES PER DAY AS NEEDED, Disp: , Rfl: 2 .  Biotin 1000 MCG tablet, Take 1,000 mcg by mouth daily., Disp: , Rfl:  .  buPROPion (WELLBUTRIN XL) 300 MG  24 hr tablet, Take 1 tablet (300 mg total) by mouth every morning., Disp: 30 tablet, Rfl: 5 .  calcium-vitamin D (OSCAL WITH D) 500-200 MG-UNIT per tablet, Take 1 tablet by mouth 2 (two) times daily.  , Disp: , Rfl:  .  Certolizumab Pegol (CIMZIA [place]), Inject 200 mg into the skin every 14 (fourteen) days., Disp: , Rfl:  .  Cholecalciferol (VITAMIN D PO), Take by mouth daily., Disp: , Rfl:  .  COLLAGEN PO, Take by mouth., Disp: , Rfl:  .  Cyanocobalamin (VITAMIN B 12 PO), Take by mouth daily., Disp: , Rfl:  .  DICLOFENAC PO, Take 75 mg by mouth 2 (two) times daily., Disp: , Rfl:  .  folic acid (FOLVITE) 1 MG tablet, Take 1 mg by mouth daily., Disp: , Rfl:  .  gabapentin (NEURONTIN) 300 MG capsule, Take 300 mg by mouth 2 (two) times daily.  , Disp: , Rfl:  .  HYDROcodone-acetaminophen (NORCO) 10-325 MG per tablet, Take 0.5 tablets by mouth every 6 (six) hours as needed. , Disp: , Rfl:  .  levonorgestrel (MIRENA) 20 MCG/24HR IUD, 1 each by Intrauterine route once., Disp: , Rfl:  .  methotrexate (RHEUMATREX) 2.5 MG tablet, Take 15 mg by mouth once a week. Caution:Chemotherapy. Protect from light.,  Disp: , Rfl:  .  phentermine 37.5 MG capsule, TAKE 1 CAPSULE (37.5 MG TOTAL) BY MOUTH EVERY MORNING., Disp: 90 capsule, Rfl: 1 .  rOPINIRole (REQUIP) 2 MG tablet, TAKE 2 TABLETS (4 MG TOTAL) BY MOUTH AT BEDTIME., Disp: 60 tablet, Rfl: 5 .  triamcinolone cream (KENALOG) 0.1 %, Apply 1 application topically 3 (three) times daily., Disp: 45 g, Rfl: 5  EXAM:  VITALS per patient if applicable:  GENERAL: alert, oriented, appears well and in no acute distress  HEENT: atraumatic, conjunttiva clear, no obvious abnormalities on inspection of external nose and ears  NECK: normal movements of the head and neck  LUNGS: on inspection no signs of respiratory distress, breathing rate appears normal, no obvious gross SOB, gasping or wheezing  CV: no obvious cyanosis  MS: moves all visible extremities without  noticeable abnormality  PSYCH/NEURO: pleasant and cooperative, no obvious depression or anxiety, speech and thought processing grossly intact  ASSESSMENT AND PLAN: This is most likely a sinusitis, so we will treat it with a Zpack. However in the interest of safety, we will also arrange for her to be tested for the Covid-19 virus.  Gershon Crane, MD  Discussed the following assessment and plan:  No diagnosis found.     I discussed the assessment and treatment plan with the patient. The patient was provided an opportunity to ask questions and all were answered. The patient agreed with the plan and demonstrated an understanding of the instructions.   The patient was advised to call back or seek an in-person evaluation if the symptoms worsen or if the condition fails to improve as anticipated.     Review of Systems     Objective:   Physical Exam        Assessment & Plan:

## 2018-09-06 ENCOUNTER — Telehealth: Payer: Self-pay | Admitting: Family Medicine

## 2018-09-06 ENCOUNTER — Other Ambulatory Visit: Payer: Commercial Managed Care - PPO

## 2018-09-06 DIAGNOSIS — Z20822 Contact with and (suspected) exposure to covid-19: Secondary | ICD-10-CM

## 2018-09-06 NOTE — Telephone Encounter (Signed)
Medication: buPROPion (WELLBUTRIN XL) 300 MG 24 hr tablet [[id number]]   Has the patient contacted their pharmacy? Yes  (Agent: If no, request that the patient contact the pharmacy for the refill.) (Agent: If yes, when and what did the pharmacy advise?)  Preferred Pharmacy (with phone number or street name): [id number] [place], [place] [phone] (Phone) [phone] (Fax)    Agent: Please be advised that RX refills may take up to 3 business days. We ask that you follow-up with your pharmacy.

## 2018-09-07 NOTE — Telephone Encounter (Signed)
Call in #90 with 3 rf  

## 2018-09-07 NOTE — Telephone Encounter (Signed)
Rx called into preferred pharmacy per Dr. Sarajane Jews with 3 refills.

## 2018-09-10 LAB — NOVEL CORONAVIRUS, NAA: SARS-CoV-2, NAA: NOT DETECTED

## 2018-09-13 ENCOUNTER — Encounter: Payer: Self-pay | Admitting: Family Medicine

## 2018-09-14 ENCOUNTER — Encounter: Payer: Self-pay | Admitting: Family Medicine

## 2018-09-14 NOTE — Telephone Encounter (Signed)
Call in Augmentin 875 bid for 10 days  

## 2018-09-15 MED ORDER — AMOXICILLIN-POT CLAVULANATE 875-125 MG PO TABS: 1.0000 | ORAL_TABLET | Freq: Two times a day (BID) | ORAL | 0 refills | Status: DC

## 2018-11-18 ENCOUNTER — Other Ambulatory Visit: Payer: Self-pay | Admitting: Family Medicine

## 2018-11-18 ENCOUNTER — Encounter: Payer: Self-pay | Admitting: Family Medicine

## 2018-11-18 NOTE — Telephone Encounter (Signed)
Called pt to schedule an appt. Pt declined and asked if I could see what Dr.Fry advised since she has been seen for the issue for it already. Pt was seen in July and stated she is still having sx.

## 2018-11-19 MED ORDER — LEVOFLOXACIN 500 MG PO TABS: 500.0000 mg | ORAL_TABLET | Freq: Every day | ORAL | 0 refills | Status: DC

## 2018-11-19 NOTE — Telephone Encounter (Signed)
Let's try one more round of antibiotics, this time a different one. Call in Levaquin 500 mg daily for 10 days

## 2018-11-23 NOTE — Telephone Encounter (Signed)
Okay for refill?  

## 2018-12-15 ENCOUNTER — Other Ambulatory Visit: Payer: Self-pay | Admitting: Family Medicine

## 2019-01-04 ENCOUNTER — Encounter: Payer: Self-pay | Admitting: Family Medicine

## 2019-01-06 ENCOUNTER — Telehealth: Payer: Commercial Managed Care - PPO | Admitting: Family Medicine

## 2019-01-06 NOTE — Telephone Encounter (Signed)
Pt has been scheduled.  °

## 2019-01-07 ENCOUNTER — Encounter: Payer: Self-pay | Admitting: Family Medicine

## 2019-01-07 ENCOUNTER — Other Ambulatory Visit: Payer: Self-pay

## 2019-01-07 ENCOUNTER — Telehealth (INDEPENDENT_AMBULATORY_CARE_PROVIDER_SITE_OTHER): Payer: Commercial Managed Care - PPO | Admitting: Family Medicine

## 2019-01-07 DIAGNOSIS — J0191 Acute recurrent sinusitis, unspecified: Secondary | ICD-10-CM

## 2019-01-07 MED ORDER — LEVOFLOXACIN 500 MG PO TABS: 500.0000 mg | ORAL_TABLET | Freq: Every day | ORAL | 0 refills | Status: DC

## 2019-01-07 MED ORDER — FLUTICASONE PROPIONATE 50 MCG/ACT NA SUSP: 2.0000 | Freq: Every day | NASAL | 11 refills | Status: DC

## 2019-01-07 NOTE — Progress Notes (Signed)
Virtual Visit via Video Note  I connected with the patient on 01/07/19 at  8:15 AM EST by a video enabled telemedicine application and verified that I am speaking with the correct person using two identifiers.  Location patient: home Location provider:work or home office Persons participating in the virtual visit: patient, provider  I discussed the limitations of evaluation and management by telemedicine and the availability of in person appointments. The patient expressed understanding and agreed to proceed.   HPI: Here for recurrent symptoms of sinus pressure, PND, and runny nose. No fever or headache or cough or SOB. Using Flonase.    ROS: See pertinent positives and negatives per HPI.  Past Medical History:  Diagnosis Date  . Allergy   . Arthritis    rheumatoid, sees Dr. Alben Deeds   . Depression   . Gynecological examination    sees Dr. Duane Lope  . Insomnia   . Low back pain Dr. Noel Gerold  . Restless legs     Past Surgical History:  Procedure Laterality Date  . back surgeries  04/09/06 06/23/06   x2 level per Dr. Noel Gerold  . CHOLECYSTECTOMY  10/15/07   per Dr. Lindie Spruce  . NECK SURGERY  11/06  . SHOULDER ARTHROSCOPY  4/07    Family History  Problem Relation Age of Onset  . Coronary artery disease Other   . Cancer Other        lung, breast     Current Outpatient Medications:  .  ALPRAZolam (XANAX) 1 MG tablet, TAKE 1 TABLET (1 MG TOTAL) BY MOUTH 2 (TWO) TIMES DAILY AS NEEDED FOR ANXIETY OR SLEEP., Disp: 60 tablet, Rfl: 5 .  amoxicillin-clavulanate (AUGMENTIN) 875-125 MG tablet, Take 1 tablet by mouth 2 (two) times daily., Disp: 20 tablet, Rfl: 0 .  baclofen (LIORESAL) 10 MG tablet, TAKE 1 TABLET (10MG ) BY ORAL ROUTE 3 TIMES PER DAY AS NEEDED, Disp: , Rfl: 2 .  Biotin 1000 MCG tablet, Take 1,000 mcg by mouth daily., Disp: , Rfl:  .  buPROPion (WELLBUTRIN XL) 300 MG 24 hr tablet, Take 1 tablet (300 mg total) by mouth every morning., Disp: 30 tablet, Rfl: 5 .   calcium-vitamin D (OSCAL WITH D) 500-200 MG-UNIT per tablet, Take 1 tablet by mouth 2 (two) times daily.  , Disp: , Rfl:  .  Certolizumab Pegol (CIMZIA [place]), Inject 200 mg into the skin every 14 (fourteen) days., Disp: , Rfl:  .  Cholecalciferol (VITAMIN D PO), Take by mouth daily., Disp: , Rfl:  .  COLLAGEN PO, Take by mouth., Disp: , Rfl:  .  Cyanocobalamin (VITAMIN B 12 PO), Take by mouth daily., Disp: , Rfl:  .  DICLOFENAC PO, Take 75 mg by mouth 2 (two) times daily., Disp: , Rfl:  .  fluticasone (FLONASE) 50 MCG/ACT nasal spray, Place 2 sprays into both nostrils daily., Disp: 16 g, Rfl: 11 .  folic acid (FOLVITE) 1 MG tablet, Take 1 mg by mouth daily., Disp: , Rfl:  .  gabapentin (NEURONTIN) 300 MG capsule, Take 300 mg by mouth 2 (two) times daily.  , Disp: , Rfl:  .  HYDROcodone-acetaminophen (NORCO) 10-325 MG per tablet, Take 0.5 tablets by mouth every 6 (six) hours as needed. , Disp: , Rfl:  .  levofloxacin (LEVAQUIN) 500 MG tablet, Take 1 tablet (500 mg total) by mouth daily., Disp: 14 tablet, Rfl: 0 .  levonorgestrel (MIRENA) 20 MCG/24HR IUD, 1 each by Intrauterine route once., Disp: , Rfl:  .  methotrexate (RHEUMATREX)  2.5 MG tablet, Take 15 mg by mouth once a week. Caution:Chemotherapy. Protect from light., Disp: , Rfl:  .  phentermine 37.5 MG capsule, TAKE 1 CAPSULE BY MOUTH EVERY MORNING, Disp: 30 capsule, Rfl: 5 .  rOPINIRole (REQUIP) 2 MG tablet, TAKE 2 TABLETS (4 MG TOTAL) BY MOUTH AT BEDTIME., Disp: 60 tablet, Rfl: 4 .  triamcinolone cream (KENALOG) 0.1 %, Apply 1 application topically 3 (three) times daily., Disp: 45 g, Rfl: 5  EXAM:  VITALS per patient if applicable:  GENERAL: alert, oriented, appears well and in no acute distress  HEENT: atraumatic, conjunttiva clear, no obvious abnormalities on inspection of external nose and ears  NECK: normal movements of the head and neck  LUNGS: on inspection no signs of respiratory distress, breathing rate appears normal, no  obvious gross SOB, gasping or wheezing  CV: no obvious cyanosis  MS: moves all visible extremities without noticeable abnormality  PSYCH/NEURO: pleasant and cooperative, no obvious depression or anxiety, speech and thought processing grossly intact  ASSESSMENT AND PLAN: Sinusitis, treat with Levaquin.  Alysia Penna, MD  Discussed the following assessment and plan:  No diagnosis found.     I discussed the assessment and treatment plan with the patient. The patient was provided an opportunity to ask questions and all were answered. The patient agreed with the plan and demonstrated an understanding of the instructions.   The patient was advised to call back or seek an in-person evaluation if the symptoms worsen or if the condition fails to improve as anticipated.

## 2019-02-01 ENCOUNTER — Ambulatory Visit: Payer: Commercial Managed Care - PPO | Admitting: Family Medicine

## 2019-02-07 ENCOUNTER — Encounter: Payer: Self-pay | Admitting: Family Medicine

## 2019-02-07 ENCOUNTER — Ambulatory Visit: Payer: Commercial Managed Care - PPO | Admitting: Family Medicine

## 2019-02-07 ENCOUNTER — Other Ambulatory Visit: Payer: Self-pay

## 2019-02-07 VITALS — BP 124/66 | HR 64 | Temp 97.7°F | Wt 220.6 lb

## 2019-02-07 DIAGNOSIS — J0191 Acute recurrent sinusitis, unspecified: Secondary | ICD-10-CM

## 2019-02-07 DIAGNOSIS — M159 Polyosteoarthritis, unspecified: Secondary | ICD-10-CM

## 2019-02-07 DIAGNOSIS — M8949 Other hypertrophic osteoarthropathy, multiple sites: Secondary | ICD-10-CM

## 2019-02-07 DIAGNOSIS — F418 Other specified anxiety disorders: Secondary | ICD-10-CM

## 2019-02-07 MED ORDER — ALPRAZOLAM 1 MG PO TABS: 1.0000 mg | ORAL_TABLET | Freq: Two times a day (BID) | ORAL | 5 refills | Status: DC | PRN

## 2019-02-07 MED ORDER — LEVOFLOXACIN 500 MG PO TABS: 500.0000 mg | ORAL_TABLET | Freq: Every day | ORAL | 0 refills | Status: DC

## 2019-02-07 MED ORDER — BUPROPION HCL ER (XL) 300 MG PO TB24: 300.0000 mg | ORAL_TABLET | Freq: Every morning | ORAL | 3 refills | Status: DC

## 2019-02-07 NOTE — Progress Notes (Signed)
   Subjective:    Patient ID: Lacey Sullivan, female    DOB: 1971-01-22, 48 y.o.   MRN: [id number]  HPI Here for refills and for a week of sinus congestion and PND. No fever or ST or cough or SOB or body aches. Her osteoarthritis has been acting up, especially in the hands. She takes oral Diclofenac every day and sometimes supplements this with Tylenol. She will see Rheumatology next month. Her depression and anxiety have been well controlled, and she is sleeping well.    Review of Systems  Constitutional: Negative.   HENT: Positive for congestion, postnasal drip and sinus pressure. Negative for sore throat.   Eyes: Negative.   Respiratory: Negative.   Cardiovascular: Negative.   Musculoskeletal: Positive for arthralgias.  Psychiatric/Behavioral: Negative.        Objective:   Physical Exam Constitutional:      Appearance: Normal appearance.  HENT:     Right Ear: Tympanic membrane, ear canal and external ear normal.     Left Ear: Tympanic membrane, ear canal and external ear normal.     Nose: Nose normal.     Mouth/Throat:     Pharynx: Oropharynx is clear.  Eyes:     Conjunctiva/sclera: Conjunctivae normal.  Pulmonary:     Effort: Pulmonary effort is normal.     Breath sounds: Normal breath sounds.  Musculoskeletal:     Comments: She has tenderness and swelling in multiple DIPs and PIPs on both hands.   Lymphadenopathy:     Cervical: No cervical adenopathy.  Neurological:     Mental Status: She is alert.  Psychiatric:        Mood and Affect: Mood normal.        Thought Content: Thought content normal.           Assessment & Plan:  Her depression and anxiety are stable, so Wellbutrin and Xanax were refilled. For the OA in the hands, she will try Voltaren gel QID. For the sinusitis, try Levaquin. Also she has been using the Flonase only intermittently, do I advised her to use this every day. Alysia Penna, MD

## 2019-03-17 ENCOUNTER — Encounter: Payer: Self-pay | Admitting: Family Medicine

## 2019-03-17 MED ORDER — LEVOFLOXACIN 500 MG PO TABS: 500.0000 mg | ORAL_TABLET | Freq: Every day | ORAL | 0 refills | Status: DC

## 2019-03-17 NOTE — Telephone Encounter (Signed)
Call in Levaquin 500 mg daily for 10 days  

## 2019-04-13 ENCOUNTER — Encounter: Payer: Self-pay | Admitting: Family Medicine

## 2019-04-15 NOTE — Telephone Encounter (Signed)
Set up an OV so we can discuss this  

## 2019-04-20 ENCOUNTER — Encounter: Payer: Self-pay | Admitting: Family Medicine

## 2019-04-20 ENCOUNTER — Other Ambulatory Visit: Payer: Self-pay

## 2019-04-20 ENCOUNTER — Telehealth (INDEPENDENT_AMBULATORY_CARE_PROVIDER_SITE_OTHER): Payer: Commercial Managed Care - PPO | Admitting: Family Medicine

## 2019-04-20 DIAGNOSIS — J329 Chronic sinusitis, unspecified: Secondary | ICD-10-CM | POA: Diagnosis not present

## 2019-04-20 MED ORDER — LEVOFLOXACIN 500 MG PO TABS: 500.0000 mg | ORAL_TABLET | Freq: Every day | ORAL | 0 refills | Status: DC

## 2019-04-20 NOTE — Progress Notes (Signed)
Virtual Visit via Video Note  I connected with the patient on 04/20/19 at  2:30 PM EST by a video enabled telemedicine application and verified that I am speaking with the correct person using two identifiers.  Location patient: home Location provider:work or home office Persons participating in the virtual visit: patient, provider  I discussed the limitations of evaluation and management by telemedicine and the availability of in person appointments. The patient expressed understanding and agreed to proceed.   HPI: Here for recurrent sinus infections. Now for the past 3 days she again has pressure over the right eye and in the right cheek, and she is blowing bloody green mucus from the nose. No fever or ST or cough. She has taken multiple courses of antibiotics and she gets better each time, however the symptoms always return a few weeks later.    ROS: See pertinent positives and negatives per HPI.  Past Medical History:  Diagnosis Date  . Allergy   . Arthritis    rheumatoid, sees Dr. Alben Deeds   . Depression   . Gynecological examination    sees Dr. Duane Lope  . Insomnia   . Low back pain Dr. Noel Gerold  . Restless legs     Past Surgical History:  Procedure Laterality Date  . back surgeries  04/09/06 06/23/06   x2 level per Dr. Noel Gerold  . CHOLECYSTECTOMY  10/15/07   per Dr. Lindie Spruce  . NECK SURGERY  11/06  . SHOULDER ARTHROSCOPY  4/07    Family History  Problem Relation Age of Onset  . Coronary artery disease Other   . Cancer Other        lung, breast     Current Outpatient Medications:  .  ALPRAZolam (XANAX) 1 MG tablet, Take 1 tablet (1 mg total) by mouth 2 (two) times daily as needed for anxiety or sleep., Disp: 60 tablet, Rfl: 5 .  baclofen (LIORESAL) 10 MG tablet, TAKE 1 TABLET (10MG ) BY ORAL ROUTE 3 TIMES PER DAY AS NEEDED, Disp: , Rfl: 2 .  Biotin 1000 MCG tablet, Take 1,000 mcg by mouth daily., Disp: , Rfl:  .  buPROPion (WELLBUTRIN XL) 300 MG 24 hr tablet, Take 1  tablet (300 mg total) by mouth every morning., Disp: 90 tablet, Rfl: 3 .  calcium-vitamin D (OSCAL WITH D) 500-200 MG-UNIT per tablet, Take 1 tablet by mouth 2 (two) times daily.  , Disp: , Rfl:  .  Certolizumab Pegol (CIMZIA [place]), Inject 200 mg into the skin every 14 (fourteen) days., Disp: , Rfl:  .  Cholecalciferol (VITAMIN D PO), Take by mouth daily., Disp: , Rfl:  .  COLLAGEN PO, Take by mouth., Disp: , Rfl:  .  Cyanocobalamin (VITAMIN B 12 PO), Take by mouth daily., Disp: , Rfl:  .  DICLOFENAC PO, Take 75 mg by mouth 2 (two) times daily., Disp: , Rfl:  .  fluticasone (FLONASE) 50 MCG/ACT nasal spray, Place 2 sprays into both nostrils daily., Disp: 16 g, Rfl: 11 .  folic acid (FOLVITE) 1 MG tablet, Take 1 mg by mouth daily., Disp: , Rfl:  .  gabapentin (NEURONTIN) 300 MG capsule, Take 300 mg by mouth 2 (two) times daily.  , Disp: , Rfl:  .  HYDROcodone-acetaminophen (NORCO) 10-325 MG per tablet, Take 0.5 tablets by mouth every 6 (six) hours as needed. , Disp: , Rfl:  .  levofloxacin (LEVAQUIN) 500 MG tablet, Take 1 tablet (500 mg total) by mouth daily., Disp: 10 tablet, Rfl: 0 .  levonorgestrel (MIRENA) 20 MCG/24HR IUD, 1 each by Intrauterine route once., Disp: , Rfl:  .  methotrexate (RHEUMATREX) 2.5 MG tablet, Take 15 mg by mouth once a week. Caution:Chemotherapy. Protect from light., Disp: , Rfl:  .  phentermine 37.5 MG capsule, TAKE 1 CAPSULE BY MOUTH EVERY MORNING, Disp: 30 capsule, Rfl: 5 .  rOPINIRole (REQUIP) 2 MG tablet, TAKE 2 TABLETS (4 MG TOTAL) BY MOUTH AT BEDTIME., Disp: 60 tablet, Rfl: 4 .  triamcinolone cream (KENALOG) 0.1 %, Apply 1 application topically 3 (three) times daily., Disp: 45 g, Rfl: 5  EXAM:  VITALS per patient if applicable:  GENERAL: alert, oriented, appears well and in no acute distress  HEENT: atraumatic, conjunttiva clear, no obvious abnormalities on inspection of external nose and ears  NECK: normal movements of the head and neck  LUNGS: on  inspection no signs of respiratory distress, breathing rate appears normal, no obvious gross SOB, gasping or wheezing  CV: no obvious cyanosis  MS: moves all visible extremities without noticeable abnormality  PSYCH/NEURO: pleasant and cooperative, no obvious depression or anxiety, speech and thought processing grossly intact  ASSESSMENT AND PLAN: Chronic sinusitis, treat again with 10 days of Levaquin. Refer to ENT to evaluate.  Alysia Penna, MD  Discussed the following assessment and plan:  Chronic sinusitis, unspecified location - Plan: Ambulatory referral to ENT     I discussed the assessment and treatment plan with the patient. The patient was provided an opportunity to ask questions and all were answered. The patient agreed with the plan and demonstrated an understanding of the instructions.   The patient was advised to call back or seek an in-person evaluation if the symptoms worsen or if the condition fails to improve as anticipated.

## 2019-04-25 DIAGNOSIS — J324 Chronic pansinusitis: Secondary | ICD-10-CM | POA: Insufficient documentation

## 2019-05-15 ENCOUNTER — Other Ambulatory Visit: Payer: Self-pay | Admitting: Family Medicine

## 2019-05-19 ENCOUNTER — Other Ambulatory Visit: Payer: Self-pay | Admitting: Otolaryngology

## 2019-08-19 ENCOUNTER — Other Ambulatory Visit: Payer: Self-pay | Admitting: Family Medicine

## 2019-08-19 NOTE — Telephone Encounter (Signed)
Last filled 02/07/2019 Last OV 04/20/2019  Ok to fill?

## 2019-10-07 ENCOUNTER — Other Ambulatory Visit: Payer: Self-pay | Admitting: Family Medicine

## 2019-10-29 ENCOUNTER — Other Ambulatory Visit: Payer: Self-pay | Admitting: Family Medicine

## 2019-11-01 ENCOUNTER — Telehealth: Payer: Self-pay | Admitting: Family Medicine

## 2019-11-01 NOTE — Telephone Encounter (Signed)
error 

## 2019-11-02 ENCOUNTER — Encounter: Payer: Self-pay | Admitting: Family Medicine

## 2019-11-02 ENCOUNTER — Telehealth (INDEPENDENT_AMBULATORY_CARE_PROVIDER_SITE_OTHER): Payer: Commercial Managed Care - PPO | Admitting: Family Medicine

## 2019-11-02 DIAGNOSIS — N951 Menopausal and female climacteric states: Secondary | ICD-10-CM | POA: Insufficient documentation

## 2019-11-02 DIAGNOSIS — G2581 Restless legs syndrome: Secondary | ICD-10-CM

## 2019-11-02 DIAGNOSIS — R6882 Decreased libido: Secondary | ICD-10-CM | POA: Insufficient documentation

## 2019-11-02 MED ORDER — ROPINIROLE HCL 2 MG PO TABS
4.0000 mg | ORAL_TABLET | Freq: Every day | ORAL | 3 refills | Status: DC
Start: 2019-11-02 — End: 2020-11-02

## 2019-11-02 NOTE — Progress Notes (Signed)
Subjective:    Patient ID: Lacey Sullivan, female    DOB: 07/23/1970, 49 y.o.   MRN: [id number]  HPI Virtual Visit via Video Note  I connected with the patient on 11/02/19 at  1:00 PM EDT by a video enabled telemedicine application and verified that I am speaking with the correct person using two identifiers.  Location patient: home Location provider:work or home office Persons participating in the virtual visit: patient, provider  I discussed the limitations of evaluation and management by telemedicine and the availability of in person appointments. The patient expressed understanding and agreed to proceed.   HPI: Here to follow up on restless legs. The Ropinirole still works very well for her and she sleeps well.    ROS: See pertinent positives and negatives per HPI.  Past Medical History:  Diagnosis Date   Allergy    Arthritis    rheumatoid, sees Dr. Alben Deeds    Depression    Gynecological examination    sees Dr. Duane Lope   Insomnia    Low back pain Dr. Noel Gerold   Restless legs     Past Surgical History:  Procedure Laterality Date   back surgeries  04/09/06 06/23/06   x2 level per Dr. Noel Gerold   CHOLECYSTECTOMY  10/15/07   per Dr. Lindie Spruce   NECK SURGERY  11/06   SHOULDER ARTHROSCOPY  4/07    Family History  Problem Relation Age of Onset   Coronary artery disease Other    Cancer Other        lung, breast     Current Outpatient Medications:    ALPRAZolam (XANAX) 1 MG tablet, TAKE 1 TABLET (1 MG TOTAL) BY MOUTH 2 (TWO) TIMES DAILY AS NEEDED FOR ANXIETY OR SLEEP., Disp: 60 tablet, Rfl: 5   baclofen (LIORESAL) 10 MG tablet, TAKE 1 TABLET (10MG ) BY ORAL ROUTE 3 TIMES PER DAY AS NEEDED, Disp: , Rfl: 2   Biotin 1000 MCG tablet, Take 1,000 mcg by mouth daily., Disp: , Rfl:    buPROPion (WELLBUTRIN XL) 300 MG 24 hr tablet, Take 1 tablet (300 mg total) by mouth every morning., Disp: 90 tablet, Rfl: 3   calcium-vitamin D (OSCAL WITH D) 500-200 MG-UNIT  per tablet, Take 1 tablet by mouth 2 (two) times daily.  , Disp: , Rfl:    Certolizumab Pegol (CIMZIA [place]), Inject 200 mg into the skin every 14 (fourteen) days., Disp: , Rfl:    Cholecalciferol (VITAMIN D PO), Take by mouth daily., Disp: , Rfl:    COLLAGEN PO, Take by mouth., Disp: , Rfl:    Cyanocobalamin (VITAMIN B 12 PO), Take by mouth daily., Disp: , Rfl:    DICLOFENAC PO, Take 75 mg by mouth 2 (two) times daily., Disp: , Rfl:    fluticasone (FLONASE) 50 MCG/ACT nasal spray, Place 2 sprays into both nostrils daily., Disp: 16 g, Rfl: 11   folic acid (FOLVITE) 1 MG tablet, Take 1 mg by mouth daily., Disp: , Rfl:    gabapentin (NEURONTIN) 300 MG capsule, Take 300 mg by mouth 2 (two) times daily.  , Disp: , Rfl:    HYDROcodone-acetaminophen (NORCO) 10-325 MG per tablet, Take 0.5 tablets by mouth every 6 (six) hours as needed. , Disp: , Rfl:    levonorgestrel (MIRENA) 20 MCG/24HR IUD, 1 each by Intrauterine route once., Disp: , Rfl:    methotrexate (RHEUMATREX) 2.5 MG tablet, Take 15 mg by mouth once a week. Caution:Chemotherapy. Protect from light., Disp: , Rfl:  phentermine 37.5 MG capsule, TAKE 1 CAPSULE BY MOUTH EVERY MORNING, Disp: 30 capsule, Rfl: 5   rOPINIRole (REQUIP) 2 MG tablet, TAKE 2 TABLETS (4 MG TOTAL) BY MOUTH AT BEDTIME., Disp: 60 tablet, Rfl: 0   triamcinolone cream (KENALOG) 0.1 %, Apply 1 application topically 3 (three) times daily., Disp: 45 g, Rfl: 5   levofloxacin (LEVAQUIN) 500 MG tablet, Take 1 tablet (500 mg total) by mouth daily., Disp: 10 tablet, Rfl: 0  EXAM:  VITALS per patient if applicable:  GENERAL: alert, oriented, appears well and in no acute distress  HEENT: atraumatic, conjunttiva clear, no obvious abnormalities on inspection of external nose and ears  NECK: normal movements of the head and neck  LUNGS: on inspection no signs of respiratory distress, breathing rate appears normal, no obvious gross SOB, gasping or wheezing  CV: no  obvious cyanosis  MS: moves all visible extremities without noticeable abnormality  PSYCH/NEURO: pleasant and cooperative, no obvious depression or anxiety, speech and thought processing grossly intact  ASSESSMENT AND PLAN: Restless legs, stable. Refilled Ropinirole.  Gershon Crane, MD  Discussed the following assessment and plan:  No diagnosis found.     I discussed the assessment and treatment plan with the patient. The patient was provided an opportunity to ask questions and all were answered. The patient agreed with the plan and demonstrated an understanding of the instructions.   The patient was advised to call back or seek an in-person evaluation if the symptoms worsen or if the condition fails to improve as anticipated.     Review of Systems     Objective:   Physical Exam        Assessment & Plan:

## 2020-01-13 ENCOUNTER — Other Ambulatory Visit: Payer: Self-pay | Admitting: Family Medicine

## 2020-02-29 ENCOUNTER — Other Ambulatory Visit: Payer: Self-pay | Admitting: Family Medicine

## 2020-02-29 ENCOUNTER — Telehealth: Payer: Self-pay | Admitting: Family Medicine

## 2020-02-29 NOTE — Telephone Encounter (Signed)
Please advise 

## 2020-02-29 NOTE — Telephone Encounter (Signed)
Patient called back and stated that she tested positive for covid today and wanted to see if she needed to do anything, please advise. CB is [phone]

## 2020-03-02 NOTE — Telephone Encounter (Signed)
Left message on machine for patient to return our call 

## 2020-03-02 NOTE — Telephone Encounter (Signed)
Drink fluids and rest. Quarantine for 10 days if symptomatic, 5 days if not

## 2020-03-06 ENCOUNTER — Other Ambulatory Visit: Payer: Commercial Managed Care - PPO

## 2020-03-16 ENCOUNTER — Other Ambulatory Visit: Payer: Self-pay | Admitting: Family Medicine

## 2020-03-27 ENCOUNTER — Other Ambulatory Visit: Payer: Self-pay | Admitting: Family Medicine

## 2020-04-10 ENCOUNTER — Other Ambulatory Visit: Payer: Self-pay | Admitting: Family Medicine

## 2020-04-24 ENCOUNTER — Other Ambulatory Visit: Payer: Self-pay | Admitting: Family Medicine

## 2020-05-07 ENCOUNTER — Encounter: Payer: Self-pay | Admitting: Family Medicine

## 2020-05-07 ENCOUNTER — Telehealth (INDEPENDENT_AMBULATORY_CARE_PROVIDER_SITE_OTHER): Payer: Commercial Managed Care - PPO | Admitting: Family Medicine

## 2020-05-07 VITALS — Temp 97.2°F | Wt 220.0 lb

## 2020-05-07 DIAGNOSIS — J40 Bronchitis, not specified as acute or chronic: Secondary | ICD-10-CM | POA: Diagnosis not present

## 2020-05-07 MED ORDER — HYDROCODONE-HOMATROPINE 5-1.5 MG/5ML PO SYRP: 5.0000 mL | ORAL_SOLUTION | ORAL | 0 refills | Status: DC | PRN

## 2020-05-07 MED ORDER — AZITHROMYCIN 250 MG PO TABS: ORAL_TABLET | ORAL | 0 refills | Status: DC

## 2020-05-07 NOTE — Progress Notes (Signed)
Subjective:    Patient ID: Lacey Sullivan, female    DOB: 1971/01/12, 50 y.o.   MRN: [id number]  HPI Virtual Visit via Video Note  I connected with the patient on 05/07/20 at  2:15 PM EDT by a video enabled telemedicine application and verified that I am speaking with the correct person using two identifiers.  Location patient: home Location provider:work or home office Persons participating in the virtual visit: patient, provider  I discussed the limitations of evaluation and management by telemedicine and the availability of in person appointments. The patient expressed understanding and agreed to proceed.   HPI: Here for 10 days of chest congestion and a dry cough. No fever or SOB. No body aches or NVD. She has tested negative for the Covid virus twice this week. Taking Mucinex DM.    ROS: See pertinent positives and negatives per HPI.  Past Medical History:  Diagnosis Date  . Allergy   . Arthritis    rheumatoid, sees Dr. Alben Deeds   . Depression   . Gynecological examination    sees Dr. Duane Lope  . Insomnia   . Low back pain Dr. Noel Gerold  . Restless legs     Past Surgical History:  Procedure Laterality Date  . back surgeries  04/09/06 06/23/06   x2 level per Dr. Noel Gerold  . CHOLECYSTECTOMY  10/15/07   per Dr. Lindie Spruce  . NECK SURGERY  11/06  . SHOULDER ARTHROSCOPY  4/07    Family History  Problem Relation Age of Onset  . Coronary artery disease Other   . Cancer Other        lung, breast     Current Outpatient Medications:  .  ALPRAZolam (XANAX) 1 MG tablet, TAKE 1 TABLET (1 MG TOTAL) BY MOUTH 2 (TWO) TIMES DAILY AS NEEDED FOR ANXIETY OR SLEEP., Disp: 60 tablet, Rfl: 5 .  azithromycin (ZITHROMAX Z-PAK) 250 MG tablet, As directed, Disp: 6 each, Rfl: 0 .  baclofen (LIORESAL) 10 MG tablet, TAKE 1 TABLET (10MG ) BY ORAL ROUTE 3 TIMES PER DAY AS NEEDED, Disp: , Rfl: 2 .  Biotin 1000 MCG tablet, Take 1,000 mcg by mouth daily., Disp: , Rfl:  .  buPROPion (WELLBUTRIN  XL) 300 MG 24 hr tablet, TAKE 1 TABLET BY MOUTH EVERY DAY IN THE MORNING, Disp: 30 tablet, Rfl: 2 .  calcium-vitamin D (OSCAL WITH D) 500-200 MG-UNIT per tablet, Take 1 tablet by mouth 2 (two) times daily., Disp: , Rfl:  .  Certolizumab Pegol (CIMZIA [place]), Inject 200 mg into the skin every 14 (fourteen) days., Disp: , Rfl:  .  Cholecalciferol (VITAMIN D PO), Take by mouth daily., Disp: , Rfl:  .  COLLAGEN PO, Take by mouth., Disp: , Rfl:  .  Cyanocobalamin (VITAMIN B 12 PO), Take by mouth daily., Disp: , Rfl:  .  DICLOFENAC PO, Take 75 mg by mouth 2 (two) times daily., Disp: , Rfl:  .  fluticasone (FLONASE) 50 MCG/ACT nasal spray, SPRAY 2 SPRAYS INTO EACH NOSTRIL EVERY DAY, Disp: 16 mL, Rfl: 0 .  folic acid (FOLVITE) 1 MG tablet, Take 1 mg by mouth daily., Disp: , Rfl:  .  gabapentin (NEURONTIN) 300 MG capsule, Take 300 mg by mouth 2 (two) times daily., Disp: , Rfl:  .  HYDROcodone-acetaminophen (NORCO) 10-325 MG per tablet, Take 0.5 tablets by mouth every 6 (six) hours as needed. , Disp: , Rfl:  .  HYDROcodone-homatropine (HYCODAN) 5-1.5 MG/5ML syrup, Take 5 mLs by mouth every 4 (  four) hours as needed for cough., Disp: 240 mL, Rfl: 0 .  levonorgestrel (MIRENA) 20 MCG/24HR IUD, 1 each by Intrauterine route once., Disp: , Rfl:  .  methotrexate (RHEUMATREX) 2.5 MG tablet, Take 15 mg by mouth once a week. Caution:Chemotherapy. Protect from light., Disp: , Rfl:  .  phentermine 37.5 MG capsule, TAKE 1 CAPSULE BY MOUTH EVERY MORNING, Disp: 30 capsule, Rfl: 5 .  rOPINIRole (REQUIP) 2 MG tablet, Take 2 tablets (4 mg total) by mouth at bedtime., Disp: 180 tablet, Rfl: 3 .  triamcinolone cream (KENALOG) 0.1 %, Apply 1 application topically 3 (three) times daily., Disp: 45 g, Rfl: 5  EXAM:  VITALS per patient if applicable:  GENERAL: alert, oriented, appears well and in no acute distress  HEENT: atraumatic, conjunttiva clear, no obvious abnormalities on inspection of external nose and ears  NECK:  normal movements of the head and neck  LUNGS: on inspection no signs of respiratory distress, breathing rate appears normal, no obvious gross SOB, gasping or wheezing  CV: no obvious cyanosis  MS: moves all visible extremities without noticeable abnormality  PSYCH/NEURO: pleasant and cooperative, no obvious depression or anxiety, speech and thought processing grossly intact  ASSESSMENT AND PLAN: Bronchitis, treat with a Zpack. Add Hydromet as needed for the cough.  Gershon Crane, MD  Discussed the following assessment and plan:  No diagnosis found.     I discussed the assessment and treatment plan with the patient. The patient was provided an opportunity to ask questions and all were answered. The patient agreed with the plan and demonstrated an understanding of the instructions.   The patient was advised to call back or seek an in-person evaluation if the symptoms worsen or if the condition fails to improve as anticipated.     Review of Systems     Objective:   Physical Exam        Assessment & Plan:

## 2020-05-09 ENCOUNTER — Encounter: Payer: Self-pay | Admitting: Family Medicine

## 2020-05-09 MED ORDER — HYDROCODONE-HOMATROPINE 5-1.5 MG/5ML PO SYRP: 5.0000 mL | ORAL_SOLUTION | ORAL | 0 refills | Status: DC | PRN

## 2020-05-09 NOTE — Telephone Encounter (Signed)
I sent it to the [facility]  

## 2020-05-09 NOTE — Telephone Encounter (Signed)
Patient called back and stated that the prescription for the cough syrup can be sent to [facility] at [facility] in [place], please advise. CB is [phone]

## 2020-05-24 ENCOUNTER — Other Ambulatory Visit: Payer: Self-pay | Admitting: Family Medicine

## 2020-06-23 ENCOUNTER — Other Ambulatory Visit: Payer: Self-pay | Admitting: Family Medicine

## 2020-07-04 ENCOUNTER — Other Ambulatory Visit: Payer: Self-pay | Admitting: Family Medicine

## 2020-07-11 IMAGING — DX DG FOOT COMPLETE 3+V*L*
3 series · 3 of 3 positions shown · non-contrast
Comparison: None.

CLINICAL DATA: Left foot injury today.

EXAM:
LEFT FOOT - COMPLETE 3+ VIEW

[foot ap]
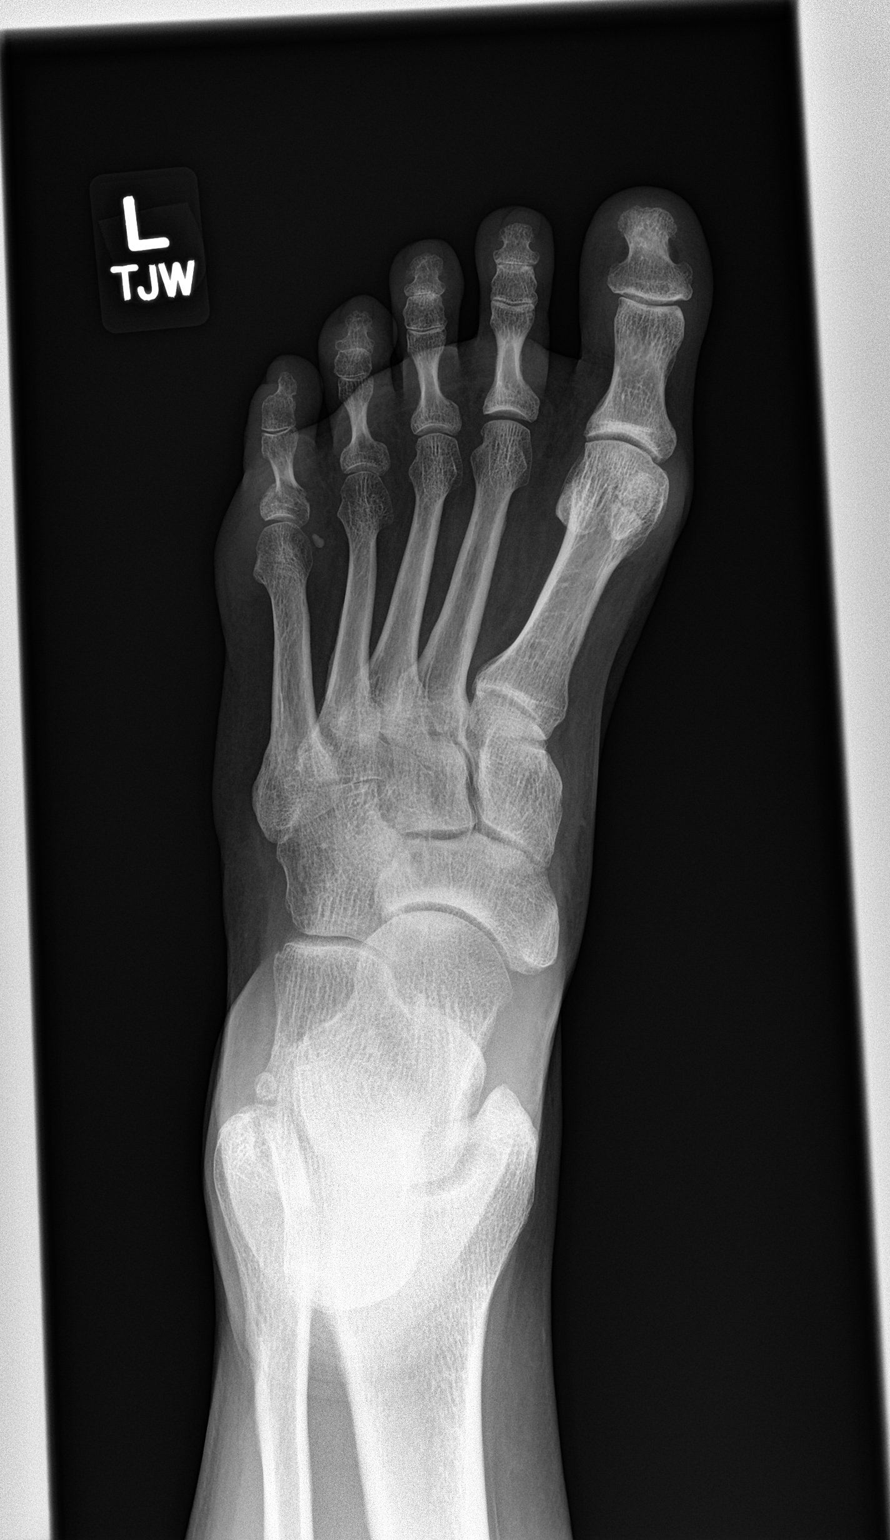

[foot obl]
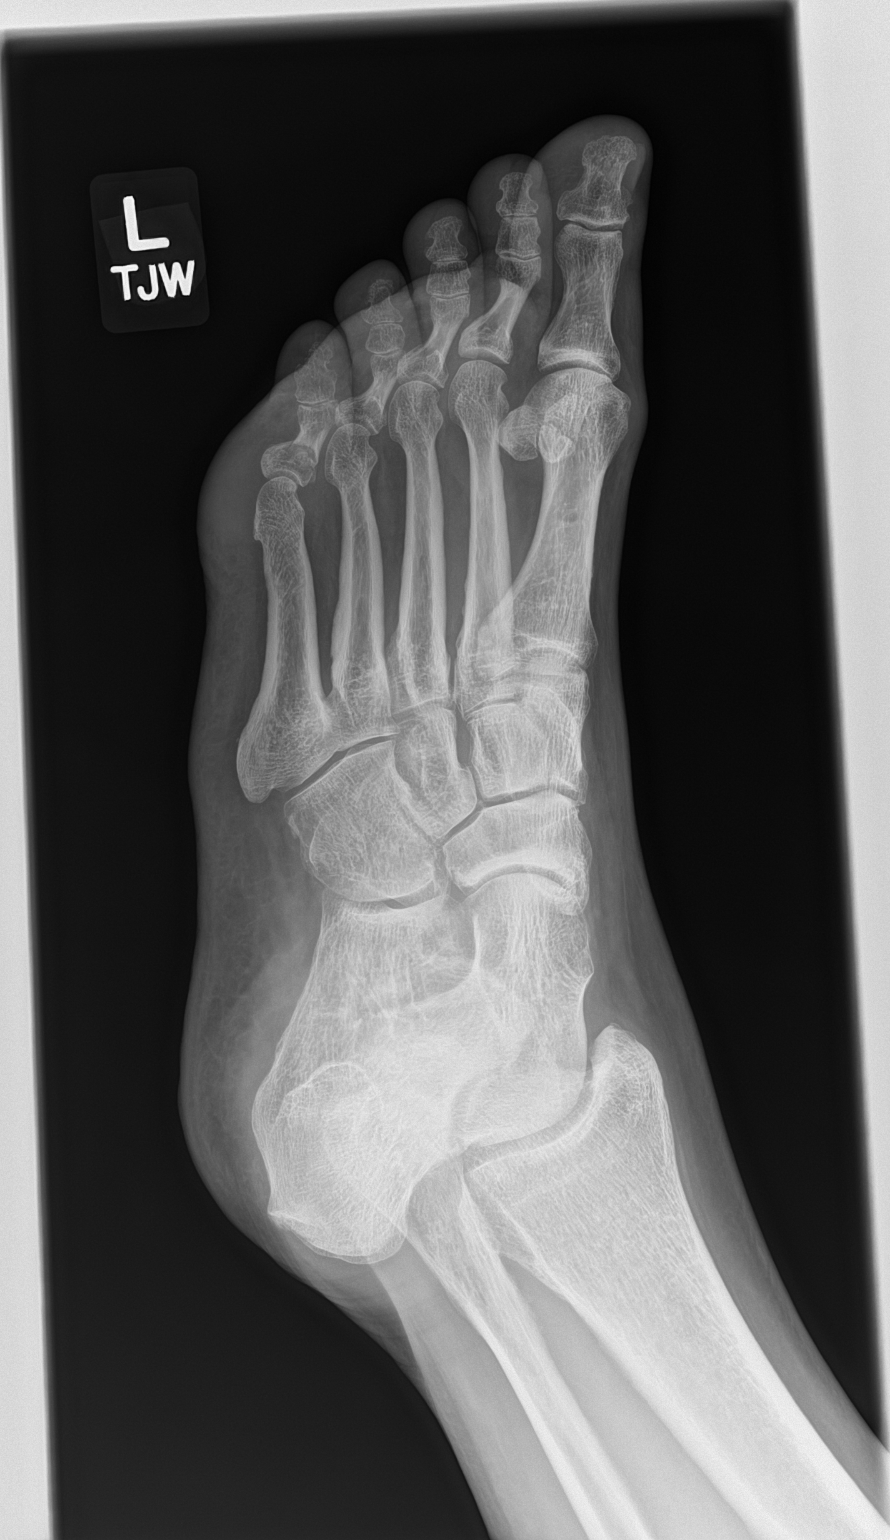

[foot lat]
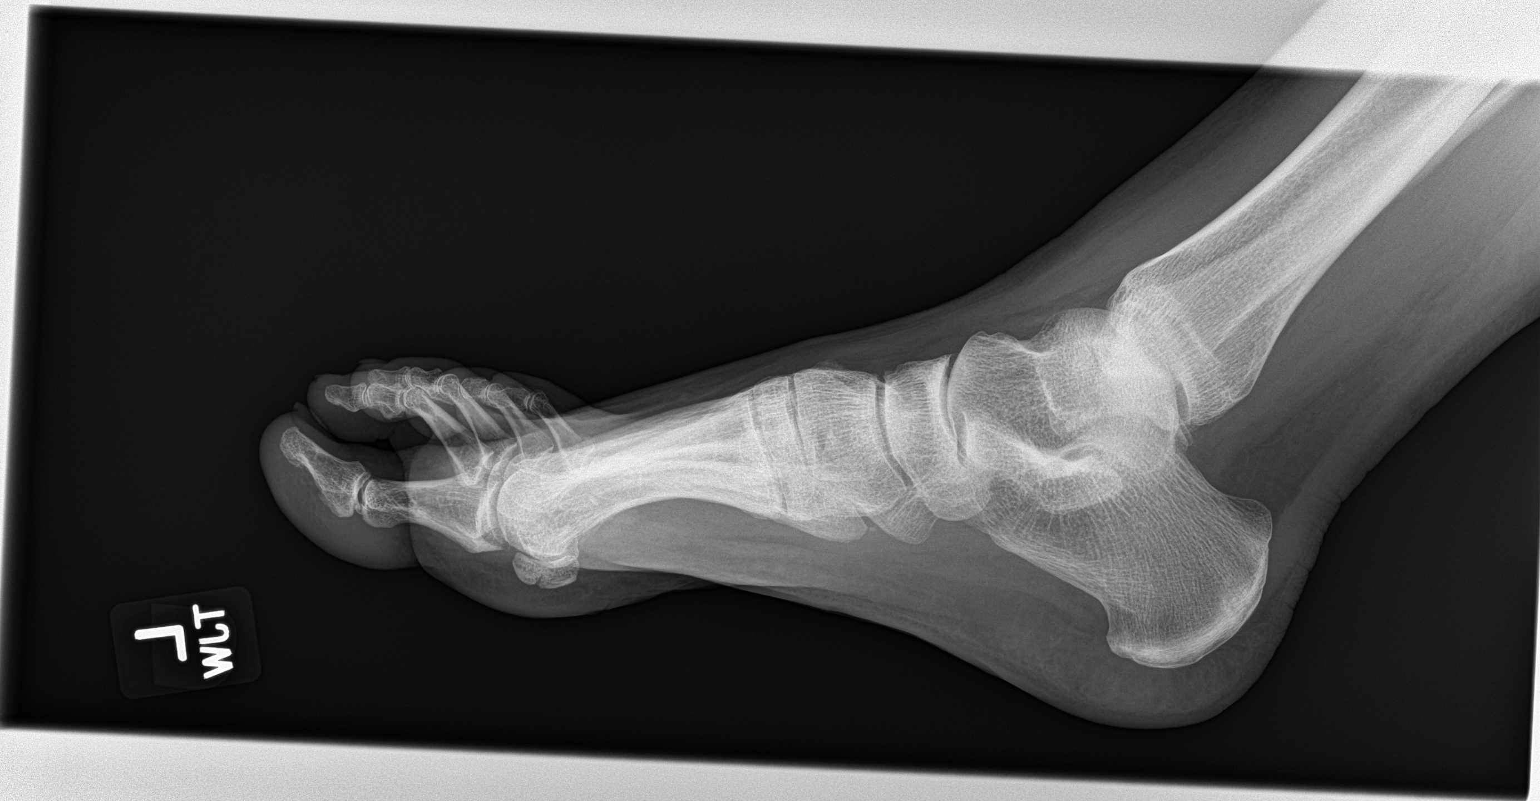

[3 of 3 positions shown; findings below may reference images not displayed]

FINDINGS: Minimally displaced fracture is seen involving the fifth proximal
phalanx. No other fracture or bony abnormality is noted. Joint
spaces are intact. No soft tissue abnormality is noted
IMPRESSION: Minimally displaced fifth proximal phalangeal fracture.

## 2020-07-30 ENCOUNTER — Other Ambulatory Visit: Payer: Self-pay

## 2020-07-30 ENCOUNTER — Other Ambulatory Visit: Payer: Self-pay | Admitting: Family Medicine

## 2020-07-30 MED ORDER — BUPROPION HCL ER (XL) 300 MG PO TB24: ORAL_TABLET | ORAL | 1 refills | Status: DC

## 2020-07-31 ENCOUNTER — Other Ambulatory Visit: Payer: Self-pay | Admitting: Family Medicine

## 2020-08-30 ENCOUNTER — Other Ambulatory Visit: Payer: Self-pay | Admitting: Family Medicine

## 2020-09-13 ENCOUNTER — Other Ambulatory Visit: Payer: Self-pay | Admitting: Family Medicine

## 2020-09-26 ENCOUNTER — Telehealth (INDEPENDENT_AMBULATORY_CARE_PROVIDER_SITE_OTHER): Payer: Self-pay | Admitting: Family Medicine

## 2020-09-26 ENCOUNTER — Encounter: Payer: Self-pay | Admitting: Family Medicine

## 2020-09-26 DIAGNOSIS — K529 Noninfective gastroenteritis and colitis, unspecified: Secondary | ICD-10-CM

## 2020-09-26 MED ORDER — LANSOPRAZOLE 15 MG PO CPDR: 30.0000 mg | DELAYED_RELEASE_CAPSULE | Freq: Every morning | ORAL | 0 refills | Status: AC

## 2020-09-26 MED ORDER — METRONIDAZOLE 500 MG PO TABS: 500.0000 mg | ORAL_TABLET | Freq: Three times a day (TID) | ORAL | 0 refills | Status: AC

## 2020-09-26 NOTE — Progress Notes (Signed)
Subjective:    Patient ID: Lacey Sullivan, female    DOB: 05/16/1970, 50 y.o.   MRN: [id number]  HPI Virtual Visit via Video Note  I connected with the patient on 09/26/20 at  1:15 PM EDT by a video enabled telemedicine application and verified that I am speaking with the correct person using two identifiers.  Location patient: home Location provider:work or home office Persons participating in the virtual visit: patient, provider  I discussed the limitations of evaluation and management by telemedicine and the availability of in person appointments. The patient expressed understanding and agreed to proceed.   HPI: Here for 7 days of watery diarrhea along with nausea (but no vomiting). No abdominal pain other than cramps just before she has a stool. No fever. No recent travel or antibiotic use. No recent medication changes. She is drinking fluids.    ROS: See pertinent positives and negatives per HPI.  Past Medical History:  Diagnosis Date   Allergy    Arthritis    rheumatoid, sees Dr. Alben Deeds    Depression    Gynecological examination    sees Dr. Duane Lope   Insomnia    Low back pain Dr. Noel Gerold   Restless legs     Past Surgical History:  Procedure Laterality Date   back surgeries  04/09/06 06/23/06   x2 level per Dr. Noel Gerold   CHOLECYSTECTOMY  10/15/07   per Dr. Lindie Spruce   NECK SURGERY  11/06   SHOULDER ARTHROSCOPY  4/07    Family History  Problem Relation Age of Onset   Coronary artery disease Other    Cancer Other        lung, breast     Current Outpatient Medications:    ALPRAZolam (XANAX) 1 MG tablet, TAKE 1 TABLET (1 MG TOTAL) BY MOUTH 2 (TWO) TIMES DAILY AS NEEDED FOR ANXIETY OR SLEEP., Disp: 60 tablet, Rfl: 5   baclofen (LIORESAL) 10 MG tablet, TAKE 1 TABLET (10MG ) BY ORAL ROUTE 3 TIMES PER DAY AS NEEDED, Disp: , Rfl: 2   Biotin 1000 MCG tablet, Take 1,000 mcg by mouth daily., Disp: , Rfl:    buPROPion (WELLBUTRIN XL) 300 MG 24 hr tablet, TAKE 1 TABLET  BY MOUTH EVERY DAY IN THE MORNING, Disp: 90 tablet, Rfl: 1   calcium-vitamin D (OSCAL WITH D) 500-200 MG-UNIT per tablet, Take 1 tablet by mouth 2 (two) times daily., Disp: , Rfl:    Certolizumab Pegol (CIMZIA Waucoma), Inject 200 mg into the skin every 14 (fourteen) days., Disp: , Rfl:    Cholecalciferol (VITAMIN D PO), Take by mouth daily., Disp: , Rfl:    COLLAGEN PO, Take by mouth., Disp: , Rfl:    Cyanocobalamin (VITAMIN B 12 PO), Take by mouth daily., Disp: , Rfl:    DICLOFENAC PO, Take 75 mg by mouth 2 (two) times daily., Disp: , Rfl:    fluticasone (FLONASE) 50 MCG/ACT nasal spray, SPRAY 2 SPRAYS INTO EACH NOSTRIL EVERY DAY, Disp: 16 mL, Rfl: 1   folic acid (FOLVITE) 1 MG tablet, Take 1 mg by mouth daily., Disp: , Rfl:    gabapentin (NEURONTIN) 300 MG capsule, Take 300 mg by mouth 2 (two) times daily., Disp: , Rfl:    HYDROcodone-acetaminophen (NORCO) 10-325 MG per tablet, Take 0.5 tablets by mouth every 6 (six) hours as needed. , Disp: , Rfl:    HYDROcodone-homatropine (HYCODAN) 5-1.5 MG/5ML syrup, Take 5 mLs by mouth every 4 (four) hours as needed for cough., Disp: 240  mL, Rfl: 0   levonorgestrel (MIRENA) 20 MCG/24HR IUD, 1 each by Intrauterine route once., Disp: , Rfl:    methotrexate (RHEUMATREX) 2.5 MG tablet, Take 15 mg by mouth once a week. Caution:Chemotherapy. Protect from light., Disp: , Rfl:    metroNIDAZOLE (FLAGYL) 500 MG tablet, Take 1 tablet (500 mg total) by mouth 3 (three) times daily for 7 days., Disp: 21 tablet, Rfl: 0   phentermine 37.5 MG capsule, TAKE 1 CAPSULE BY MOUTH EVERY MORNING, Disp: 30 capsule, Rfl: 5   rOPINIRole (REQUIP) 2 MG tablet, Take 2 tablets (4 mg total) by mouth at bedtime., Disp: 180 tablet, Rfl: 3   triamcinolone cream (KENALOG) 0.1 %, Apply 1 application topically 3 (three) times daily., Disp: 45 g, Rfl: 5  EXAM:  VITALS per patient if applicable:  GENERAL: alert, oriented, appears well and in no acute distress  HEENT: atraumatic, conjunttiva  clear, no obvious abnormalities on inspection of external nose and ears  NECK: normal movements of the head and neck  LUNGS: on inspection no signs of respiratory distress, breathing rate appears normal, no obvious gross SOB, gasping or wheezing  CV: no obvious cyanosis  MS: moves all visible extremities without noticeable abnormality  PSYCH/NEURO: pleasant and cooperative, no obvious depression or anxiety, speech and thought processing grossly intact  ASSESSMENT AND PLAN: Enteritis, treat with 7 days of Metronidazole. She can use Imodium if needed. Follow up if not resolved in 7 days.  Gershon Crane, MD  Discussed the following assessment and plan:  No diagnosis found.     I discussed the assessment and treatment plan with the patient. The patient was provided an opportunity to ask questions and all were answered. The patient agreed with the plan and demonstrated an understanding of the instructions.   The patient was advised to call back or seek an in-person evaluation if the symptoms worsen or if the condition fails to improve as anticipated.      Review of Systems     Objective:   Physical Exam        Assessment & Plan:

## 2020-10-16 ENCOUNTER — Other Ambulatory Visit: Payer: Self-pay | Admitting: Family Medicine

## 2020-10-19 ENCOUNTER — Other Ambulatory Visit: Payer: Self-pay

## 2020-10-19 ENCOUNTER — Encounter: Payer: Self-pay | Admitting: Family Medicine

## 2020-10-19 MED ORDER — FLUCONAZOLE 150 MG PO TABS: ORAL_TABLET | ORAL | 2 refills | Status: DC

## 2020-10-19 NOTE — Telephone Encounter (Signed)
Since this started right after taking an antibiotic, this is likely a yeast infection. Call in Diflucan 150 mg to take as needed, #1 with 2 rf. If she is not better by next week she should make an in person OV

## 2020-11-02 ENCOUNTER — Other Ambulatory Visit: Payer: Self-pay | Admitting: Family Medicine

## 2021-01-13 ENCOUNTER — Other Ambulatory Visit: Payer: Self-pay | Admitting: Family Medicine

## 2021-02-06 ENCOUNTER — Telehealth (INDEPENDENT_AMBULATORY_CARE_PROVIDER_SITE_OTHER): Payer: Self-pay | Admitting: Family Medicine

## 2021-02-06 ENCOUNTER — Encounter: Payer: Self-pay | Admitting: Family Medicine

## 2021-02-06 DIAGNOSIS — J0110 Acute frontal sinusitis, unspecified: Secondary | ICD-10-CM

## 2021-02-06 MED ORDER — AMOXICILLIN-POT CLAVULANATE 875-125 MG PO TABS: 1.0000 | ORAL_TABLET | Freq: Two times a day (BID) | ORAL | 0 refills | Status: DC

## 2021-02-06 NOTE — Telephone Encounter (Signed)
I spoke with the pt and she has been added to Dr. Lucie Leather schedule for today.

## 2021-02-06 NOTE — Progress Notes (Signed)
Patient ID: Lacey Sullivan, female   DOB: [birth year], 50 y.o.   MRN: [id number]   This visit type was conducted due to national recommendations for restrictions regarding the COVID-19 pandemic in an effort to limit this patient's exposure and mitigate transmission in our community.   Virtual Visit via Telephone Note  I connected with Lacey Sullivan on 02/06/21 at  5:15 PM EST by telephone and verified that I am speaking with the correct person using two identifiers.   I discussed the limitations, risks, security and privacy concerns of performing an evaluation and management service by telephone and the availability of in person appointments. I also discussed with the patient that there may be a patient responsible charge related to this service. The patient expressed understanding and agreed to proceed.  Location patient: home Location provider: work or home office Participants present for the call: patient, provider Patient did not have a visit in the prior 7 days to address this/these issue(s).   History of Present Illness:  Lacey Sullivan has over 2-week history of sinusitis symptoms.  She has had some frontal headaches.  Sinus pressure.  COVID testing negative x2.  She has had sinus surgery previously as recently as couple years ago.  She has had some cough which is worse at night.  She has rheumatoid arthritis and takes immunosuppressant.  Increased fatigue.  Symptoms not improving with conservative measures.  No known antibiotic allergies.  Past Medical History:  Diagnosis Date   Allergy    Arthritis    rheumatoid, sees Dr. Alben Deeds    Depression    Gynecological examination    sees Dr. Duane Lope   Insomnia    Low back pain Dr. Noel Gerold   Restless legs    Past Surgical History:  Procedure Laterality Date   back surgeries  04/09/06 06/23/06   x2 level per Dr. Noel Gerold   CHOLECYSTECTOMY  10/15/07   per Dr. Lindie Spruce   NECK SURGERY  11/06   SHOULDER ARTHROSCOPY  4/07    reports that she  has been smoking cigarettes. She has been smoking an average of 1 pack per day. She has never used smokeless tobacco. She reports current alcohol use. She reports that she does not use drugs. family history includes Cancer in an other family member; Coronary artery disease in an other family member. Allergies  Allergen Reactions   Inhaled Anticholinergic Agents     Like strep throat       Observations/Objective: Patient sounds cheerful and well on the phone. I do not appreciate any SOB. Speech and thought processing are grossly intact. Patient reported vitals:  Assessment and Plan:  Probable recurrent frontal sinusitis.  Longstanding history of recurrent sinusitis and previous sinus surgery.  She also has risk factor of immunosuppression from her RA medication  -Start Augmentin 875 mg twice daily for 10 days -Consider nasal saline irrigation which she has done previously -Follow-up with primary for any persistent or worsening symptoms.  Follow Up Instructions   [id number] 5-10 [id number] 11-20 [id number] 21-30 I did not refer this patient for an OV in the next 24 hours for this/these issue(s).  I discussed the assessment and treatment plan with the patient. The patient was provided an opportunity to ask questions and all were answered. The patient agreed with the plan and demonstrated an understanding of the instructions.   The patient was advised to call back or seek an in-person evaluation if the symptoms worsen or if the condition fails to improve as anticipated.  I provided 12 minutes of non-face-to-face time during this encounter.   Evelena Peat, MD

## 2021-04-02 ENCOUNTER — Encounter: Payer: Self-pay | Admitting: Family Medicine

## 2021-04-03 NOTE — Telephone Encounter (Signed)
She will need an in person OV to discuss refills (I haven't seen her in several years)

## 2021-07-20 ENCOUNTER — Other Ambulatory Visit: Payer: Self-pay | Admitting: Family Medicine

## 2021-08-20 ENCOUNTER — Other Ambulatory Visit: Payer: Self-pay | Admitting: Family Medicine

## 2021-08-20 DIAGNOSIS — F418 Other specified anxiety disorders: Secondary | ICD-10-CM

## 2021-09-04 ENCOUNTER — Encounter: Payer: Self-pay | Admitting: Family Medicine

## 2021-09-04 MED ORDER — ROPINIROLE HCL ER 4 MG PO TB24: 8.0000 mg | ORAL_TABLET | Freq: Every day | ORAL | 5 refills | Status: DC

## 2021-09-04 NOTE — Telephone Encounter (Signed)
I double the dose to taking two 4 mg pills at bedtime. Hopefully this will work better

## 2021-09-21 ENCOUNTER — Other Ambulatory Visit: Payer: Self-pay | Admitting: Family Medicine

## 2021-09-23 NOTE — Telephone Encounter (Signed)
Last VV with Dr. Caryl Never- 02/06/21 Last refill-10/18/2020--60 tabs, 5 refills  No future OV scheduled.

## 2021-10-01 ENCOUNTER — Encounter: Payer: Self-pay | Admitting: Family Medicine

## 2021-10-01 ENCOUNTER — Ambulatory Visit (INDEPENDENT_AMBULATORY_CARE_PROVIDER_SITE_OTHER): Payer: Commercial Managed Care - HMO | Admitting: Family Medicine

## 2021-10-01 VITALS — BP 118/76 | HR 111 | Temp 98.6°F | Ht 66.0 in | Wt 238.0 lb

## 2021-10-01 DIAGNOSIS — J019 Acute sinusitis, unspecified: Secondary | ICD-10-CM | POA: Diagnosis not present

## 2021-10-01 DIAGNOSIS — M069 Rheumatoid arthritis, unspecified: Secondary | ICD-10-CM

## 2021-10-01 DIAGNOSIS — L739 Follicular disorder, unspecified: Secondary | ICD-10-CM | POA: Diagnosis not present

## 2021-10-01 MED ORDER — MUPIROCIN 2 % EX OINT: 1.0000 | TOPICAL_OINTMENT | Freq: Two times a day (BID) | CUTANEOUS | 5 refills | Status: DC

## 2021-10-01 MED ORDER — PHENTERMINE HCL 37.5 MG PO CAPS: 37.5000 mg | ORAL_CAPSULE | ORAL | 5 refills | Status: DC

## 2021-10-01 MED ORDER — DOXYCYCLINE HYCLATE 100 MG PO CAPS: 100.0000 mg | ORAL_CAPSULE | Freq: Two times a day (BID) | ORAL | 5 refills | Status: DC

## 2021-10-01 NOTE — Progress Notes (Signed)
   Subjective:    Patient ID: Lacey Sullivan, female    DOB: 1970-12-12, 51 y.o.   MRN: [id number]  HPI Here for several issues. First she developed tender itchy lesions on her legs and larger bumps in her armpits about 3 months ago. They often come to a head and drain foul smelling fluid. She also has tender crusty areas in both nostrils. No fever. She has been taking Cimzia for the last 8 years to treat her RA, and this has been very effective. Also she thinks she has a sinus infection. For the past 10 days she has had sinus pressure and PND. No cough or SOB. Lastly she asks to try Phentermine again to help her lose weight. She is going through menopause, and she says she tends to eat and snack all the time.    Review of Systems  Constitutional: Negative.   HENT:  Positive for congestion, postnasal drip and sinus pressure. Negative for ear pain and sore throat.   Eyes: Negative.   Respiratory: Negative.    Cardiovascular: Negative.   Skin:  Positive for rash.       Objective:   Physical Exam Constitutional:      Appearance: She is obese. She is not ill-appearing.  HENT:     Right Ear: Tympanic membrane, ear canal and external ear normal.     Left Ear: Tympanic membrane, ear canal and external ear normal.     Nose: Nose normal.     Mouth/Throat:     Pharynx: Oropharynx is clear.  Eyes:     Conjunctiva/sclera: Conjunctivae normal.  Pulmonary:     Effort: Pulmonary effort is normal.     Breath sounds: Normal breath sounds.  Lymphadenopathy:     Cervical: No cervical adenopathy.  Skin:    Comments: There are widespread red papular lesions on both legs. Many of these are excoriated.   Neurological:     Mental Status: She is alert.           Assessment & Plan:  She has a folliculitis on her legs, likely from a Staph bacteria. This is also involving her nostrils. I explained that skin infections are common side effects of immunosuppressive medications like Cimza. We will  treat this with Doxycycline 100 mg BID, and she will try Mupiricin ointment in the nostrils. She seems to have a sinusitis as well, and the Doxycycline should cover this. For the obesity, she will get back on Phentermine 37.5 mg daily.  Gershon Crane, MD

## 2021-11-19 ENCOUNTER — Other Ambulatory Visit: Payer: Self-pay | Admitting: Family Medicine

## 2021-11-19 DIAGNOSIS — F418 Other specified anxiety disorders: Secondary | ICD-10-CM

## 2021-12-12 ENCOUNTER — Other Ambulatory Visit: Payer: Self-pay | Admitting: Family Medicine

## 2021-12-12 ENCOUNTER — Telehealth: Payer: Self-pay | Admitting: Family Medicine

## 2021-12-12 DIAGNOSIS — F418 Other specified anxiety disorders: Secondary | ICD-10-CM

## 2021-12-12 NOTE — Telephone Encounter (Signed)
Pt called to request assistance with a refill for the following:  buPROPion (WELLBUTRIN XL) 300 MG 24 hr tablet  Pt states pharmacy is saying it was denied.  LOV:  10/01/21   [id number] - [place], [place] - [address] Phone:  [phone]  Fax:  [phone]

## 2021-12-12 NOTE — Telephone Encounter (Signed)
Called patient to inform three month supply for Bupropion XR 300 mg was [facility] to [facility] on 11-19-21.

## 2022-02-15 ENCOUNTER — Other Ambulatory Visit: Payer: Self-pay | Admitting: Family Medicine

## 2022-02-15 DIAGNOSIS — F418 Other specified anxiety disorders: Secondary | ICD-10-CM

## 2022-02-17 NOTE — Telephone Encounter (Signed)
Last OV 10/01/21 Next OV: none scheduled

## 2022-03-11 ENCOUNTER — Other Ambulatory Visit: Payer: Self-pay | Admitting: Family Medicine

## 2022-04-04 ENCOUNTER — Other Ambulatory Visit: Payer: Self-pay | Admitting: Family Medicine

## 2022-04-07 NOTE — Telephone Encounter (Signed)
Last OV-10/01/21 Last refill Xanax-09/23/21-60 tabs, 5 refills Last refill- Phentermine-10/01/21--30 capsules, 5 refills  No future OV scheduled.

## 2022-04-20 ENCOUNTER — Other Ambulatory Visit: Payer: Self-pay | Admitting: Family Medicine

## 2022-04-20 DIAGNOSIS — F418 Other specified anxiety disorders: Secondary | ICD-10-CM

## 2022-05-27 ENCOUNTER — Other Ambulatory Visit: Payer: Self-pay | Admitting: Family Medicine

## 2022-05-28 ENCOUNTER — Other Ambulatory Visit: Payer: Self-pay | Admitting: Family Medicine

## 2022-05-28 DIAGNOSIS — F418 Other specified anxiety disorders: Secondary | ICD-10-CM

## 2022-05-28 NOTE — Telephone Encounter (Signed)
Last OV-10/01/21 Last refill-10/01/21--60 tabs, 5 refills  No future OV scheduled.

## 2022-07-01 ENCOUNTER — Other Ambulatory Visit: Payer: Self-pay | Admitting: Family Medicine

## 2022-08-19 ENCOUNTER — Other Ambulatory Visit: Payer: Self-pay | Admitting: Family Medicine

## 2022-09-01 ENCOUNTER — Other Ambulatory Visit: Payer: Self-pay | Admitting: Family Medicine

## 2022-09-01 DIAGNOSIS — F418 Other specified anxiety disorders: Secondary | ICD-10-CM

## 2022-10-28 ENCOUNTER — Other Ambulatory Visit: Payer: Self-pay | Admitting: Family Medicine

## 2022-10-28 DIAGNOSIS — F418 Other specified anxiety disorders: Secondary | ICD-10-CM

## 2023-01-12 ENCOUNTER — Other Ambulatory Visit: Payer: Self-pay | Admitting: Family Medicine

## 2023-01-14 NOTE — Telephone Encounter (Signed)
Pt LOV 10/01/2021 Please advise

## 2023-02-08 ENCOUNTER — Other Ambulatory Visit: Payer: Self-pay | Admitting: Family Medicine

## 2023-02-08 DIAGNOSIS — F418 Other specified anxiety disorders: Secondary | ICD-10-CM

## 2023-02-09 LAB — EXTERNAL GENERIC LAB PROCEDURE

## 2023-02-09 LAB — COLOGUARD

## 2023-03-05 ENCOUNTER — Other Ambulatory Visit: Payer: Self-pay | Admitting: Family Medicine

## 2023-03-05 DIAGNOSIS — F418 Other specified anxiety disorders: Secondary | ICD-10-CM

## 2023-03-17 ENCOUNTER — Encounter: Payer: Self-pay | Admitting: Family Medicine

## 2023-03-17 ENCOUNTER — Ambulatory Visit: Payer: 59 | Admitting: Family Medicine

## 2023-03-17 VITALS — BP 98/60 | HR 91 | Temp 98.4°F | Ht 66.0 in | Wt 174.0 lb

## 2023-03-17 DIAGNOSIS — Z0000 Encounter for general adult medical examination without abnormal findings: Secondary | ICD-10-CM

## 2023-03-17 DIAGNOSIS — F418 Other specified anxiety disorders: Secondary | ICD-10-CM | POA: Diagnosis not present

## 2023-03-17 DIAGNOSIS — E538 Deficiency of other specified B group vitamins: Secondary | ICD-10-CM | POA: Diagnosis not present

## 2023-03-17 LAB — CBC WITH DIFFERENTIAL/PLATELET
Basophils Absolute: 0.1 10*3/uL (ref 0.0–0.1)
Basophils Relative: 0.4 % (ref 0.0–3.0)
Eosinophils Absolute: 0.4 10*3/uL (ref 0.0–0.7)
Eosinophils Relative: 2.8 % (ref 0.0–5.0)
HCT: 42 % (ref 36.0–46.0)
Hemoglobin: 14 g/dL (ref 12.0–15.0)
Lymphocytes Relative: 21.6 % (ref 12.0–46.0)
Lymphs Abs: 2.9 10*3/uL (ref 0.7–4.0)
MCHC: 33.5 g/dL (ref 30.0–36.0)
MCV: 91.7 fL (ref 78.0–100.0)
Monocytes Absolute: 0.5 10*3/uL (ref 0.1–1.0)
Monocytes Relative: 4 % (ref 3.0–12.0)
Neutro Abs: 9.7 10*3/uL — ABNORMAL HIGH (ref 1.4–7.7)
Neutrophils Relative %: 71.2 % (ref 43.0–77.0)
Platelets: 247 10*3/uL (ref 150.0–400.0)
RBC: 4.58 Mil/uL (ref 3.87–5.11)
RDW: 13.9 % (ref 11.5–15.5)
WBC: 13.7 10*3/uL — ABNORMAL HIGH (ref 4.0–10.5)

## 2023-03-17 LAB — LIPID PANEL
Cholesterol: 167 mg/dL (ref 0–200)
HDL: 36 mg/dL — ABNORMAL LOW (ref >39.00)
LDL Cholesterol: 103 mg/dL — ABNORMAL HIGH (ref 0–99)
NonHDL: 131.14
Total CHOL/HDL Ratio: 5
Triglycerides: 140 mg/dL (ref 0.0–149.0)
VLDL: 28 mg/dL (ref 0.0–40.0)

## 2023-03-17 LAB — BASIC METABOLIC PANEL
BUN: 21 mg/dL (ref 6–23)
CO2: 27 meq/L (ref 19–32)
Calcium: 9.5 mg/dL (ref 8.4–10.5)
Chloride: 102 meq/L (ref 96–112)
Creatinine, Ser: 1 mg/dL (ref 0.40–1.20)
GFR: 64.97 mL/min (ref >60.00)
Glucose, Bld: 84 mg/dL (ref 70–99)
Potassium: 4.3 meq/L (ref 3.5–5.1)
Sodium: 136 meq/L (ref 135–145)

## 2023-03-17 LAB — HEPATIC FUNCTION PANEL
ALT: 19 U/L (ref 0–35)
AST: 22 U/L (ref 0–37)
Albumin: 4.1 g/dL (ref 3.5–5.2)
Alkaline Phosphatase: 77 U/L (ref 39–117)
Bilirubin, Direct: 0.1 mg/dL (ref 0.0–0.3)
Total Bilirubin: 0.4 mg/dL (ref 0.2–1.2)
Total Protein: 7.2 g/dL (ref 6.0–8.3)

## 2023-03-17 LAB — TSH: TSH: 0.75 u[IU]/mL (ref 0.35–5.50)

## 2023-03-17 LAB — VITAMIN B12: Vitamin B-12: >1537 pg/mL — ABNORMAL HIGH (ref 211–911)

## 2023-03-17 LAB — HEMOGLOBIN A1C: Hgb A1c MFr Bld: 5.5 % (ref 4.6–6.5)

## 2023-03-17 MED ORDER — ALPRAZOLAM 1 MG PO TABS: 0.5000 mg | ORAL_TABLET | Freq: Every day | ORAL | Status: DC

## 2023-03-17 MED ORDER — BUPROPION HCL ER (XL) 150 MG PO TB24: 150.0000 mg | ORAL_TABLET | Freq: Every day | ORAL | 3 refills | Status: DC

## 2023-03-17 NOTE — Progress Notes (Signed)
Subjective:    Patient ID: Lawson Fiscal, female    DOB: 01-07-1971, 53 y.o.   MRN: [id number]  HPI Here for a well exam. She feels fine. She has been taking Semaglutide shots from her rheumatology office, and she has lost 60 lbs in the past year. Her depression has been very stable, and she asks if she can stop the Wellbutrin.    Review of Systems  Constitutional: Negative.   HENT: Negative.    Eyes: Negative.   Respiratory: Negative.    Cardiovascular: Negative.   Gastrointestinal: Negative.   Genitourinary:  Negative for decreased urine volume, difficulty urinating, dyspareunia, dysuria, enuresis, flank pain, frequency, hematuria, pelvic pain and urgency.  Musculoskeletal: Negative.   Skin: Negative.   Neurological: Negative.  Negative for headaches.  Psychiatric/Behavioral: Negative.         Objective:   Physical Exam Constitutional:      General: She is not in acute distress.    Appearance: Normal appearance. She is well-developed.  HENT:     Head: Normocephalic and atraumatic.     Right Ear: External ear normal.     Left Ear: External ear normal.     Nose: Nose normal.     Mouth/Throat:     Pharynx: No oropharyngeal exudate.  Eyes:     General: No scleral icterus.    Conjunctiva/sclera: Conjunctivae normal.     Pupils: Pupils are equal, round, and reactive to light.  Neck:     Thyroid: No thyromegaly.     Vascular: No JVD.  Cardiovascular:     Rate and Rhythm: Normal rate and regular rhythm.     Pulses: Normal pulses.     Heart sounds: Normal heart sounds. No murmur heard.    No friction rub. No gallop.  Pulmonary:     Effort: Pulmonary effort is normal. No respiratory distress.     Breath sounds: Normal breath sounds. No wheezing or rales.  Chest:     Chest wall: No tenderness.  Abdominal:     General: Bowel sounds are normal. There is no distension.     Palpations: Abdomen is soft. There is no mass.     Tenderness: There is no abdominal tenderness.  There is no guarding or rebound.  Musculoskeletal:        General: No tenderness. Normal range of motion.     Cervical back: Normal range of motion and neck supple.  Lymphadenopathy:     Cervical: No cervical adenopathy.  Skin:    General: Skin is warm and dry.     Findings: No erythema or rash.  Neurological:     General: No focal deficit present.     Mental Status: She is alert and oriented to person, place, and time.     Cranial Nerves: No cranial nerve deficit.     Motor: No abnormal muscle tone.     Coordination: Coordination normal.     Deep Tendon Reflexes: Reflexes are normal and symmetric. Reflexes normal.  Psychiatric:        Mood and Affect: Mood normal.        Behavior: Behavior normal.        Thought Content: Thought content normal.        Judgment: Judgment normal.           Assessment & Plan:  Well exam. We discussed diet and exercise. Get fasting labs. We will decrease the Wellbutrin XL to 150 mg daily for a few weeks to see  how she does. After that we may stop it altogether.  Gershon Crane, MD

## 2023-03-18 ENCOUNTER — Encounter: Payer: Self-pay | Admitting: *Deleted

## 2023-04-01 ENCOUNTER — Encounter: Payer: Self-pay | Admitting: Family Medicine

## 2023-05-11 ENCOUNTER — Encounter: Payer: Self-pay | Admitting: Pediatrics

## 2023-05-28 ENCOUNTER — Ambulatory Visit (AMBULATORY_SURGERY_CENTER)

## 2023-05-28 ENCOUNTER — Telehealth: Payer: Self-pay

## 2023-05-28 VITALS — Ht 66.0 in | Wt 162.5 lb

## 2023-05-28 DIAGNOSIS — Z1211 Encounter for screening for malignant neoplasm of colon: Secondary | ICD-10-CM

## 2023-05-28 MED ORDER — SUFLAVE 178.7 G PO SOLR: 1.0000 | Freq: Once | ORAL | 0 refills | Status: AC

## 2023-05-28 NOTE — Telephone Encounter (Signed)
 Patient disclosed during pre visit that she had a history of Malignant Hyperthermia with her Maternal Aunt.  Her Aunt was hospitalized in ICU after surgery and almost died.

## 2023-05-28 NOTE — Progress Notes (Signed)
 No egg or soy allergy known to patient  No issues known to pt with past sedation with any surgeries or procedures Patient denies ever being told they had issues or difficulty with intubation  Yes Patients Aunt FH of Malignant Hyperthermia Pt is not on diet pills Pt is not on  home 02  Pt is not on blood thinners  Pt has issues with constipation uses Miralax No A fib or A flutter Have any cardiac testing pending--no Pt can ambulate independently Pt denies use of chewing tobacco Discussed diabetic I weight loss medication holds Discussed NSAID holds Checked BMI Pt instructed to use [employer] or GoodRx for a price reduction on prep  Patient's chart reviewed by Cathlyn Parsons CNRA prior to previsit and patient appropriate for the [facility].  Pre visit completed and red dot placed by patient's name on their procedure day (on provider's schedule).

## 2023-06-16 ENCOUNTER — Encounter: Payer: Self-pay | Admitting: Pediatrics

## 2023-06-16 NOTE — Progress Notes (Unsigned)
 [facility] History and Physical   Primary Care Physician:  Donley Furth, MD   Reason for Procedure:  Colorectal cancer screening  Plan:    Screening colonoscopy     HPI: Lacey Sullivan is a 53 y.o. female undergoing screening colonoscopy for colorectal cancer screening.  This is the patient's first colonoscopy.  No family history of colorectal cancer or polyps.  Patient denies change in bowel habits or rectal bleeding.   Past Medical History:  Diagnosis Date   Allergy    Anxiety    Arthritis    rheumatoid, sees Dr. Kirtland Perfect    Depression    GERD (gastroesophageal reflux disease)    Gynecological examination    sees Dr. Cherly Corners   Insomnia    Low back pain Dr. Faylene Hoots   Restless legs     Past Surgical History:  Procedure Laterality Date   back surgeries  04/09/06 06/23/06   x2 level per Dr. Faylene Hoots   CHOLECYSTECTOMY  10/15/07   per Dr. Mammie Sears   NECK SURGERY  11/06   SHOULDER ARTHROSCOPY  4/07    Prior to Admission medications   Medication Sig Start Date End Date Taking? Authorizing Provider  ALPRAZolam  (XANAX ) 1 MG tablet Take 0.5 tablets (0.5 mg total) by mouth at bedtime. 03/17/23   Donley Furth, MD  Biotin 1000 MCG tablet Take 1,000 mcg by mouth daily.    [provider]  budesonide (PULMICORT) 0.5 MG/2ML nebulizer solution RINSE RIGHT-SIDED SINUSES WITH HALF OF NEILMED BOTTLE. THEN, MIX ONE RESPULE IN REMAINING VOLUME AND RINSE SINUSES WITH MIXTURE. REPEAT TWICE DAILY. Patient not taking: Reported on 05/28/2023 09/06/20   [provider]  buPROPion  (WELLBUTRIN  XL) 150 MG 24 hr tablet Take 1 tablet (150 mg total) by mouth daily. 03/17/23   Donley Furth, MD  Calcium 200 MG TABS     [provider]  Calcium Carb-Cholecalciferol (OYSTER SHELL CALCIUM W/D) 500-5 MG-MCG TABS Take by mouth. Patient not taking: Reported on 05/28/2023 04/25/19   [provider]  calcium carbonate (SUPER CALCIUM) 1500 (600 Ca) MG TABS tablet 1  tablet with meals Orally Twice a day for 30 day(s) Patient not taking: Reported on 05/28/2023    [provider]  Certolizumab Pegol (CIMZIA [place]) Inject 200 mg into the skin every 14 (fourteen) days.    [provider]  Certolizumab Pegol [facility])     [provider]  cholecalciferol (VITAMIN D3) 25 MCG (1000 UNIT) tablet 1 tablet Orally Once a day for 30 day(s)    [provider]  COLLAGEN PO Take by mouth.    [provider]  Cyanocobalamin  (VITAMIN B 12 PO) Take by mouth daily.    [provider]  diclofenac (VOLTAREN) 75 MG EC tablet TAKE 1 TABLET BY MOUTH TWICE A DAY WITH FOOD OR MILK Orally Once a day for 30 days 04/04/19   [provider]  DICLOFENAC PO Take 75 mg by mouth 2 (two) times daily.    [provider]  doxycycline  (VIBRAMYCIN ) 100 MG capsule TAKE 1 CAPSULE BY MOUTH TWICE A DAY 01/15/23   Donley Furth, MD  fluticasone  (FLONASE ) 50 MCG/ACT nasal spray SPRAY 2 SPRAYS INTO EACH NOSTRIL EVERY DAY Patient not taking: Reported on 05/28/2023 04/13/19   [provider]  folic acid (FOLVITE) 1 MG tablet Take 1 mg by mouth daily.    [provider]  gabapentin (NEURONTIN) 300 MG capsule Take 300 mg by mouth 2 (  two) times daily.    [provider]  lansoprazole  (PREVACID ) 15 MG capsule Take 2 capsules (30 mg total) by mouth in the morning. 09/26/20   Donley Furth, MD  levonorgestrel (MIRENA) 20 MCG/24HR IUD 1 each by Intrauterine route once.    [provider]  lidocaine (LIDODERM) 5 % APPLY 1 PATCH EVERY DAY MAY WEAR UP TO 12 HOURS 02/20/19   [provider]  methocarbamol (ROBAXIN) 500 MG tablet Take 1 tablet by mouth 3 (three) times daily. Patient not taking: Reported on 05/28/2023    [provider]  methotrexate (RHEUMATREX) 2.5 MG tablet Take 15 mg by mouth once a week. Caution:Chemotherapy. Protect from light.    [provider]  Methotrexate 2.5 MG/ML  SOLN 6 tablets Orally once weekly for 28 days Patient not taking: Reported on 05/28/2023    [provider]  Multiple Vitamin (MULTIVITAMIN ADULT PO)     [provider]  rOPINIRole  (REQUIP  XL) 4 MG 24 hr tablet TAKE 2 TABLETS (8 MG TOTAL) BY MOUTH AT BEDTIME. 02/09/23   Donley Furth, MD  Semaglutide-Weight Management 2.4 MG/0.75ML SOAJ Inject 2.5 mg into the skin once a week.    [provider]    Current Outpatient Medications  Medication Sig Dispense Refill   ALPRAZolam  (XANAX ) 1 MG tablet Take 0.5 tablets (0.5 mg total) by mouth at bedtime.     Biotin 1000 MCG tablet Take 1,000 mcg by mouth daily.     budesonide (PULMICORT) 0.5 MG/2ML nebulizer solution RINSE RIGHT-SIDED SINUSES WITH HALF OF NEILMED BOTTLE. THEN, MIX ONE RESPULE IN REMAINING VOLUME AND RINSE SINUSES WITH MIXTURE. REPEAT TWICE DAILY. (Patient not taking: Reported on 05/28/2023)     buPROPion  (WELLBUTRIN  XL) 150 MG 24 hr tablet Take 1 tablet (150 mg total) by mouth daily. 90 tablet 3   Calcium 200 MG TABS  (Patient not taking: Reported on 05/28/2023)     Calcium Carb-Cholecalciferol (OYSTER SHELL CALCIUM W/D) 500-5 MG-MCG TABS Take by mouth. (Patient not taking: Reported on 05/28/2023)     calcium carbonate (SUPER CALCIUM) 1500 (600 Ca) MG TABS tablet 1 tablet with meals Orally Twice a day for 30 day(s) (Patient not taking: Reported on 05/28/2023)     Certolizumab Pegol (CIMZIA [place]) Inject 200 mg into the skin every 14 (fourteen) days.     Certolizumab Pegol (CIMZIA [place])      cholecalciferol (VITAMIN D3) 25 MCG (1000 UNIT) tablet 1 tablet Orally Once a day for 30 day(s)     COLLAGEN PO Take by mouth.     Cyanocobalamin  (VITAMIN B 12 PO) Take by mouth daily.     diclofenac (VOLTAREN) 75 MG EC tablet TAKE 1 TABLET BY MOUTH TWICE A DAY WITH FOOD OR MILK Orally Once a day for 30 days     DICLOFENAC PO Take 75 mg by mouth 2 (two) times daily.     doxycycline  (VIBRAMYCIN ) 100 MG capsule TAKE 1 CAPSULE BY MOUTH  TWICE A DAY 60 capsule 5   fluticasone  (FLONASE ) 50 MCG/ACT nasal spray SPRAY 2 SPRAYS INTO EACH NOSTRIL EVERY DAY (Patient not taking: Reported on 05/28/2023)     folic acid (FOLVITE) 1 MG tablet Take 1 mg by mouth daily.     gabapentin (NEURONTIN) 300 MG capsule Take 300 mg by mouth 2 (two) times daily.     lansoprazole  (PREVACID ) 15 MG capsule Take 2 capsules (30 mg total) by mouth in the morning. 2 capsule 0   levonorgestrel (MIRENA) 20  MCG/24HR IUD 1 each by Intrauterine route once.     lidocaine (LIDODERM) 5 % APPLY 1 PATCH EVERY DAY MAY WEAR UP TO 12 HOURS     methocarbamol (ROBAXIN) 500 MG tablet Take 1 tablet by mouth 3 (three) times daily. (Patient not taking: Reported on 05/28/2023)     methotrexate (RHEUMATREX) 2.5 MG tablet Take 15 mg by mouth once a week. Caution:Chemotherapy. Protect from light.     Methotrexate 2.5 MG/ML SOLN 6 tablets Orally once weekly for 28 days (Patient not taking: Reported on 05/28/2023)     Multiple Vitamin (MULTIVITAMIN ADULT PO)  (Patient not taking: Reported on 05/28/2023)     rOPINIRole  (REQUIP  XL) 4 MG 24 hr tablet TAKE 2 TABLETS (8 MG TOTAL) BY MOUTH AT BEDTIME. 60 tablet 5   Semaglutide-Weight Management 2.4 MG/0.75ML SOAJ Inject 2.5 mg into the skin once a week.     No current facility-administered medications for this visit.    Allergies as of 06/17/2023 - Review Complete 05/28/2023  Allergen Reaction Noted   Inhaled anticholinergic agents  07/12/2010    Family History  Problem Relation Age of Onset   Coronary artery disease Other    Cancer Other        lung, breast   Colon cancer Neg Hx    Colon polyps Neg Hx    Esophageal cancer Neg Hx    Rectal cancer Neg Hx    Stomach cancer Neg Hx     Social History   Socioeconomic History   Marital status: Married    Spouse name: Not on file   Number of children: Not on file   Years of education: Not on file   Highest education level: Not on file  Occupational History   Not on file  Tobacco  Use   Smoking status: Every Day    Current packs/day: 1.00    Types: Cigarettes   Smokeless tobacco: Never  Vaping Use   Vaping status: Some Days  Substance and Sexual Activity   Alcohol use: Yes    Alcohol/week: 0.0 standard drinks of alcohol    Comment: occ   Drug use: No    Comment: occasional CBD gummy   Sexual activity: Not on file  Other Topics Concern   Not on file  Social History Narrative   Not on file   Social Drivers of Health   Financial Resource Strain: Not on file  Food Insecurity: Not on file  Transportation Needs: Not on file  Physical Activity: Not on file  Stress: Not on file  Social Connections: Not on file  Intimate Partner Violence: Not on file    Review of Systems:  All other review of systems negative except as mentioned in the HPI.  Physical Exam: Vital signs There were no vitals taken for this visit.  General:   Alert,  Well-developed, well-nourished, pleasant and cooperative in NAD Airway:  Mallampati  Lungs:  Clear throughout to auscultation.   Heart:  Regular rate and rhythm; no murmurs, clicks, rubs,  or gallops. Abdomen:  Soft, nontender and nondistended. Normal bowel sounds.   Neuro/Psych:  Normal mood and affect. A and O x 3  Eugenia Hess, MD [facility]

## 2023-06-17 ENCOUNTER — Encounter: Payer: Self-pay | Admitting: Pediatrics

## 2023-06-17 ENCOUNTER — Ambulatory Visit: Admitting: Pediatrics

## 2023-06-17 VITALS — BP 116/58 | HR 91 | Temp 98.4°F | Resp 14 | Ht 66.0 in | Wt 162.5 lb

## 2023-06-17 DIAGNOSIS — Z1211 Encounter for screening for malignant neoplasm of colon: Secondary | ICD-10-CM | POA: Diagnosis present

## 2023-06-17 DIAGNOSIS — K649 Unspecified hemorrhoids: Secondary | ICD-10-CM

## 2023-06-17 DIAGNOSIS — K635 Polyp of colon: Secondary | ICD-10-CM | POA: Diagnosis not present

## 2023-06-17 DIAGNOSIS — D125 Benign neoplasm of sigmoid colon: Secondary | ICD-10-CM | POA: Diagnosis not present

## 2023-06-17 DIAGNOSIS — K648 Other hemorrhoids: Secondary | ICD-10-CM | POA: Diagnosis not present

## 2023-06-17 DIAGNOSIS — D124 Benign neoplasm of descending colon: Secondary | ICD-10-CM | POA: Diagnosis not present

## 2023-06-17 MED ORDER — SODIUM CHLORIDE 0.9 % IV SOLN
500.0000 mL | Freq: Once | INTRAVENOUS | Status: DC
Start: 2023-06-17 — End: 2023-06-17

## 2023-06-17 NOTE — Progress Notes (Signed)
 Sedate, gd SR, tolerated procedure well, VSS, report to RN

## 2023-06-17 NOTE — Progress Notes (Signed)
 Called to room to assist during endoscopic procedure.  Patient ID and intended procedure confirmed with present staff. Received instructions for my participation in the procedure from the performing physician.

## 2023-06-17 NOTE — Progress Notes (Signed)
 Pt's states no medical or surgical changes since previsit or office visit.

## 2023-06-17 NOTE — Patient Instructions (Signed)

## 2023-06-17 NOTE — Op Note (Signed)
 [facility] Patient Name: Lacey Sullivan Procedure Date: 06/17/2023 10:26 AM MRN: [id number] Endoscopist: Eugenia Hess , MD, [id number] Age: 53 Referring MD:  Date of Birth: [birth year] Gender: Female Account #: [id number] Procedure:                Colonoscopy Indications:              Screening for colorectal malignant neoplasm, This                            is the patient's first colonoscopy Medicines:                Monitored Anesthesia Care Procedure:                Pre-Anesthesia Assessment:                           - Prior to the procedure, a History and Physical                            was performed, and patient medications and                            allergies were reviewed. The patient's tolerance of                            previous anesthesia was also reviewed. The risks                            and benefits of the procedure and the sedation                            options and risks were discussed with the patient.                            All questions were answered, and informed consent                            was obtained. Prior Anticoagulants: The patient has                            taken no anticoagulant or antiplatelet agents. ASA                            Grade Assessment: II - A patient with mild systemic                            disease. After reviewing the risks and benefits,                            the patient was deemed in satisfactory condition to                            undergo the procedure.  After obtaining informed consent, the colonoscope                            was passed under direct vision. Throughout the                            procedure, the patient's blood pressure, pulse, and                            oxygen saturations were monitored continuously. The                            CF [id number] #[id number] was introduced through the anus                            and advanced to the  cecum, identified by                            appendiceal orifice and ileocecal valve. The                            colonoscopy was performed without difficulty. The                            patient tolerated the procedure well. The quality                            of the bowel preparation was good. The ileocecal                            valve, appendiceal orifice, and rectum were                            photographed. Scope In: 10:29:27 AM Scope Out: 10:56:47 AM Scope Withdrawal Time: 0 hours 19 minutes 52 seconds  Total Procedure Duration: 0 hours 27 minutes 20 seconds  Findings:                 Hemorrhoids were found on perianal exam.                           The digital rectal exam was normal. Pertinent                            negatives include normal sphincter tone and no                            palpable rectal lesions.                           Six sessile polyps were found in the sigmoid colon                            (2) and descending colon (4). The polyps were 4 to  5 mm in size. These polyps were removed with a cold                            snare. Resection and retrieval were complete.                           Internal hemorrhoids were found during retroflexion. Complications:            No immediate complications. Estimated blood loss:                            Minimal. Estimated Blood Loss:     Estimated blood loss was minimal. Impression:               - Hemorrhoids found on perianal exam.                           - Six 4 to 5 mm polyps in the sigmoid colon and in                            the descending colon, removed with a cold snare.                            Resected and retrieved.                           - Internal hemorrhoids. Recommendation:           - Discharge patient to home (ambulatory).                           - Await pathology results.                           - Repeat colonoscopy for surveillance  based on                            pathology results.                           - The findings and recommendations were discussed                            with the patient's family.                           - Return to referring physician.                           - Patient has a contact number available for                            emergencies. The signs and symptoms of potential                            delayed complications were discussed with the  patient. Return to normal activities tomorrow.                            Written discharge instructions were provided to the                            patient. Eugenia Hess, MD 06/17/2023 11:01:21 AM This report has been signed electronically.

## 2023-06-18 ENCOUNTER — Telehealth: Payer: Self-pay

## 2023-06-18 NOTE — Telephone Encounter (Signed)
  Follow up Call-     06/17/2023    9:54 AM  Call back number  Post procedure Call Back phone  # [phone]  Permission to leave phone message Yes     Patient questions:  Do you have a fever, pain , or abdominal swelling? No. Pain Score  0 *  Have you tolerated food without any problems? Yes.    Have you been able to return to your normal activities? Yes.    Do you have any questions about your discharge instructions: Diet   No. Medications  No. Follow up visit  No.  Do you have questions or concerns about your Care? No.  Actions: * If pain score is 4 or above: No action needed, pain <4.

## 2023-06-19 LAB — SURGICAL PATHOLOGY

## 2023-06-21 ENCOUNTER — Encounter: Payer: Self-pay | Admitting: Pediatrics

## 2023-08-14 ENCOUNTER — Other Ambulatory Visit: Payer: Self-pay

## 2023-08-14 ENCOUNTER — Encounter: Payer: Self-pay | Admitting: Family Medicine

## 2023-08-17 MED ORDER — ROPINIROLE HCL ER 4 MG PO TB24: 8.0000 mg | ORAL_TABLET | Freq: Every day | ORAL | 5 refills | Status: DC

## 2023-08-17 NOTE — Telephone Encounter (Signed)
 Done

## 2023-08-19 ENCOUNTER — Telehealth: Payer: Self-pay

## 2023-08-19 ENCOUNTER — Other Ambulatory Visit (HOSPITAL_COMMUNITY): Payer: Self-pay

## 2023-08-19 NOTE — Telephone Encounter (Signed)
 Pharmacy Patient Advocate Encounter   Received notification from CoverMyMeds that prior authorization for rOPINIRole  HCl ER 4MG  er tablets  is required/requested.   Insurance verification completed.   The patient is insured through [facility] .   Per test claim: PA required; PA started via CoverMyMeds. KEY [id number] . Waiting for clinical questions to populate.

## 2023-08-20 NOTE — Telephone Encounter (Signed)
 PLEASE BE ADVISED Clinical questions have been answered and PA submitted.TO PLAN. PA currently Pending.

## 2023-08-24 NOTE — Telephone Encounter (Signed)
 Pharmacy Patient Advocate Encounter  Received notification from [employer] that Prior Authorization for rOPINIRole  HCl ER 4MG  er tablets  has been DENIED.  See denial reason below. Denial letter attached TO MEDIA OF CHART FOR FULL DENIAL.  WHY WAS MY REQUEST DENIED  The request for coverage for ROPINIROLE  TAB 4MG  ER, use as directed (60 per month), is DENIED. This decision is based on health plan criteria for ROPINIROLE  TAB 4MG  ER. This medicine is covered only if: (1) The use of the drug is not excluded as documented in the limitation and exclusions section. (2) One of the following: (A) The drug is supported in DrugDex and one of the following: (I) The drug has a Strength of Recommendation in the Food Drug Administration Uses/Non-Food Drug Administration Uses section rating of Class I, Class IIa, or Class IIb (see DRUGDEX Strength of Recommendation table in Background section). SEE FULL DENIAL IN MEDIA OF CHART   PA #/Case ID/Reference #: [id number]

## 2023-09-24 ENCOUNTER — Encounter: Payer: Self-pay | Admitting: Family Medicine

## 2023-09-28 MED ORDER — ROPINIROLE HCL 4 MG PO TABS: 4.0000 mg | ORAL_TABLET | Freq: Every day | ORAL | 2 refills | Status: DC

## 2023-09-28 NOTE — Telephone Encounter (Signed)
 I cancelled the order for ER Ropinirole , and instead I sent in regular 4 mg Ropinirole . I hope this will be covered

## 2023-11-10 ENCOUNTER — Encounter: Payer: Self-pay | Admitting: Family Medicine

## 2023-11-11 MED ORDER — ROPINIROLE HCL 4 MG PO TABS: 8.0000 mg | ORAL_TABLET | Freq: Every day | ORAL | 5 refills | Status: DC

## 2023-11-11 MED ORDER — ALPRAZOLAM 1 MG PO TABS: 0.5000 mg | ORAL_TABLET | Freq: Every day | ORAL | 5 refills | Status: AC

## 2023-11-11 NOTE — Telephone Encounter (Signed)
 Done

## 2024-02-09 ENCOUNTER — Other Ambulatory Visit: Payer: Self-pay | Admitting: Family Medicine

## 2024-03-17 ENCOUNTER — Other Ambulatory Visit: Payer: Self-pay | Admitting: Family Medicine

## 2024-03-17 ENCOUNTER — Encounter: Payer: Self-pay | Admitting: Family Medicine

## 2024-03-18 NOTE — Telephone Encounter (Signed)
 I can do that but she will need an OV for this since we have not seen her in a year

## 2024-03-25 ENCOUNTER — Ambulatory Visit: Admitting: Family Medicine

## 2024-03-30 ENCOUNTER — Ambulatory Visit: Admitting: Family Medicine

## 2024-03-30 VITALS — BP 110/70 | HR 93 | Temp 98.7°F | Wt 175.0 lb

## 2024-03-30 DIAGNOSIS — G2581 Restless legs syndrome: Secondary | ICD-10-CM | POA: Diagnosis not present

## 2024-03-30 DIAGNOSIS — L309 Dermatitis, unspecified: Secondary | ICD-10-CM | POA: Diagnosis not present

## 2024-03-30 MED ORDER — BETAMETHASONE DIPROPIONATE AUG 0.05 % EX CREA: TOPICAL_CREAM | Freq: Two times a day (BID) | CUTANEOUS | 2 refills | Status: AC

## 2024-03-30 MED ORDER — ROPINIROLE HCL 5 MG PO TABS: 10.0000 mg | ORAL_TABLET | Freq: Every day | ORAL | 5 refills | Status: AC

## 2024-03-30 NOTE — Progress Notes (Signed)
" ° °  Subjective:    Patient ID: Lacey Sullivan, female    DOB: Aug 25, 1970, 54 y.o.   MRN: [id number]  HPI Here for 2 issues. First she has had restless legs for years, and the 8 mg dose at bedtime worked well for several years. Lately however this has gotten worse. This keeps both her and her husband up at night. The other issues is an itchy rash that appeared on her hands and elbows a few weeks ago. She has never had this before.    Review of Systems  Constitutional: Negative.   Respiratory: Negative.    Cardiovascular: Negative.   Skin:  Positive for rash.       Objective:   Physical Exam Constitutional:      Appearance: Normal appearance.  Cardiovascular:     Rate and Rhythm: Normal rate and regular rhythm.     Pulses: Normal pulses.     Heart sounds: Normal heart sounds.  Pulmonary:     Effort: Pulmonary effort is normal.     Breath sounds: Normal breath sounds.  Skin:    Comments: There are patches of raised erythematous skin on both dorsal hands and in both antecubital flexures   Neurological:     Mental Status: She is alert.           Assessment & Plan:  For the restless legs, we will increase the Ropinerole to 8 mg at bedtime. She also has eczema, and we will treat this with Betamethasone  cream BID as needed.  Garnette Olmsted, MD   "
